# Patient Record
Sex: Male | Born: 1985 | Race: White | Hispanic: No | Marital: Married | Smoking: Never smoker
Health system: Southern US, Community
[De-identification: ages and names within clinical notes are randomized; demographics above are authoritative.]

## PROBLEM LIST (undated history)

## (undated) ENCOUNTER — Ambulatory Visit: Admission: EM | Payer: No Typology Code available for payment source

## (undated) DIAGNOSIS — E785 Hyperlipidemia, unspecified: Secondary | ICD-10-CM

## (undated) DIAGNOSIS — Z8619 Personal history of other infectious and parasitic diseases: Secondary | ICD-10-CM

## (undated) DIAGNOSIS — Z87898 Personal history of other specified conditions: Secondary | ICD-10-CM

## (undated) DIAGNOSIS — J45909 Unspecified asthma, uncomplicated: Secondary | ICD-10-CM

## (undated) DIAGNOSIS — K429 Umbilical hernia without obstruction or gangrene: Secondary | ICD-10-CM

## (undated) DIAGNOSIS — Z8709 Personal history of other diseases of the respiratory system: Secondary | ICD-10-CM

## (undated) DIAGNOSIS — F419 Anxiety disorder, unspecified: Secondary | ICD-10-CM

## (undated) DIAGNOSIS — K4090 Unilateral inguinal hernia, without obstruction or gangrene, not specified as recurrent: Secondary | ICD-10-CM

## (undated) DIAGNOSIS — Z91030 Bee allergy status: Secondary | ICD-10-CM

## (undated) DIAGNOSIS — Z91038 Other insect allergy status: Secondary | ICD-10-CM

## (undated) DIAGNOSIS — K219 Gastro-esophageal reflux disease without esophagitis: Secondary | ICD-10-CM

## (undated) DIAGNOSIS — F32A Depression, unspecified: Secondary | ICD-10-CM

## (undated) DIAGNOSIS — M1A9XX0 Chronic gout, unspecified, without tophus (tophi): Secondary | ICD-10-CM

## (undated) HISTORY — DX: Personal history of other diseases of the respiratory system: Z87.09

## (undated) HISTORY — DX: Other insect allergy status: Z91.038

## (undated) HISTORY — DX: Bee allergy status: Z91.030

## (undated) HISTORY — DX: Personal history of other infectious and parasitic diseases: Z86.19

## (undated) HISTORY — DX: Personal history of other specified conditions: Z87.898

## (undated) HISTORY — DX: Unspecified asthma, uncomplicated: J45.909

---
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
## 2007-03-31 ENCOUNTER — Ambulatory Visit: Payer: Self-pay | Admitting: Internal Medicine

## 2012-04-05 ENCOUNTER — Emergency Department: Payer: Self-pay | Admitting: *Deleted

## 2012-09-28 ENCOUNTER — Emergency Department (HOSPITAL_COMMUNITY): Payer: 59

## 2012-09-28 ENCOUNTER — Encounter (HOSPITAL_COMMUNITY): Payer: Self-pay | Admitting: *Deleted

## 2012-09-28 ENCOUNTER — Encounter (HOSPITAL_COMMUNITY): Payer: Self-pay | Admitting: Anesthesiology

## 2012-09-28 ENCOUNTER — Emergency Department (HOSPITAL_COMMUNITY): Payer: 59 | Admitting: Anesthesiology

## 2012-09-28 ENCOUNTER — Ambulatory Visit: Payer: Self-pay | Admitting: Internal Medicine

## 2012-09-28 ENCOUNTER — Encounter (HOSPITAL_COMMUNITY)
Admission: EM | Disposition: A | Discharge status: Home / Self Care | Payer: Self-pay | Source: Home / Self Care | Attending: Emergency Medicine

## 2012-09-28 ENCOUNTER — Emergency Department (HOSPITAL_COMMUNITY)
Admission: EM | Admit: 2012-09-28 | Discharge: 2012-09-28 | Disposition: A | Discharge status: Home / Self Care | Payer: 59 | Attending: Emergency Medicine | Admitting: Emergency Medicine

## 2012-09-28 DIAGNOSIS — S56129A Laceration of flexor muscle, fascia and tendon of unspecified finger at forearm level, initial encounter: Secondary | ICD-10-CM

## 2012-09-28 DIAGNOSIS — S61209A Unspecified open wound of unspecified finger without damage to nail, initial encounter: Secondary | ICD-10-CM | POA: Insufficient documentation

## 2012-09-28 DIAGNOSIS — W268XXA Contact with other sharp object(s), not elsewhere classified, initial encounter: Secondary | ICD-10-CM | POA: Insufficient documentation

## 2012-09-28 DIAGNOSIS — S61218A Laceration without foreign body of other finger without damage to nail, initial encounter: Secondary | ICD-10-CM

## 2012-09-28 DIAGNOSIS — IMO0002 Reserved for concepts with insufficient information to code with codable children: Secondary | ICD-10-CM | POA: Insufficient documentation

## 2012-09-28 HISTORY — PX: NERVE AND TENDON REPAIR: SHX5693

## 2012-09-28 HISTORY — PX: I & D EXTREMITY: SHX5045

## 2012-09-28 SURGERY — IRRIGATION AND DEBRIDEMENT EXTREMITY
Anesthesia: General | Site: Finger | Laterality: Left | Wound class: Clean

## 2012-09-28 MED ORDER — SUCCINYLCHOLINE CHLORIDE 20 MG/ML IJ SOLN
INTRAMUSCULAR | Status: DC | PRN
  Administered 2012-09-28: 120 mg via INTRAVENOUS

## 2012-09-28 MED ORDER — LACTATED RINGERS IV SOLN
INTRAVENOUS | Status: DC | PRN
  Administered 2012-09-28 (×2): via INTRAVENOUS

## 2012-09-28 MED ORDER — ONDANSETRON HCL 4 MG/2ML IJ SOLN
INTRAMUSCULAR | Status: DC | PRN
  Administered 2012-09-28: 4 mg via INTRAVENOUS

## 2012-09-28 MED ORDER — OXYCODONE-ACETAMINOPHEN 5-325 MG PO TABS
1.0000 | ORAL_TABLET | Freq: Once | ORAL | Status: AC
  Administered 2012-09-28: 1 via ORAL

## 2012-09-28 MED ORDER — HYDROMORPHONE HCL PF 1 MG/ML IJ SOLN: 0.2500 mg | INTRAMUSCULAR | Status: DC | PRN

## 2012-09-28 MED ORDER — CEPHALEXIN 500 MG PO CAPS: 500.0000 mg | ORAL_CAPSULE | Freq: Four times a day (QID) | ORAL | Status: DC

## 2012-09-28 MED ORDER — OXYCODONE-ACETAMINOPHEN 5-325 MG PO TABS: 2.0000 | ORAL_TABLET | ORAL | Status: DC | PRN

## 2012-09-28 MED ORDER — OXYCODONE HCL 5 MG PO TABS: 5.0000 mg | ORAL_TABLET | Freq: Once | ORAL | Status: DC | PRN

## 2012-09-28 MED ORDER — SODIUM CHLORIDE 0.9 % IR SOLN
Status: DC | PRN
  Administered 2012-09-28: 3000 mL

## 2012-09-28 MED ORDER — PROMETHAZINE HCL 25 MG/ML IJ SOLN
6.2500 mg | INTRAMUSCULAR | Status: DC | PRN
  Filled 2012-09-28: qty 1

## 2012-09-28 MED ORDER — PROPOFOL 10 MG/ML IV BOLUS
INTRAVENOUS | Status: DC | PRN
  Administered 2012-09-28: 200 mg via INTRAVENOUS

## 2012-09-28 MED ORDER — CEFAZOLIN SODIUM-DEXTROSE 2-3 GM-% IV SOLR
INTRAVENOUS | Status: DC | PRN
  Administered 2012-09-28: 2 g via INTRAVENOUS

## 2012-09-28 MED ORDER — DEXTROSE 5 % IV SOLN
INTRAVENOUS | Status: DC | PRN
  Administered 2012-09-28: 21:00:00 via INTRAVENOUS

## 2012-09-28 MED ORDER — FENTANYL CITRATE 0.05 MG/ML IJ SOLN
INTRAMUSCULAR | Status: DC | PRN
  Administered 2012-09-28: 50 ug via INTRAVENOUS
  Administered 2012-09-28: 100 ug via INTRAVENOUS

## 2012-09-28 MED ORDER — MIDAZOLAM HCL 5 MG/5ML IJ SOLN
INTRAMUSCULAR | Status: DC | PRN
  Administered 2012-09-28: 2 mg via INTRAVENOUS

## 2012-09-28 MED ORDER — OXYCODONE HCL 5 MG/5ML PO SOLN: 5.0000 mg | Freq: Once | ORAL | Status: DC | PRN

## 2012-09-28 SURGICAL SUPPLY — 49 items
BANDAGE CONFORM 2  STR LF (GAUZE/BANDAGES/DRESSINGS) IMPLANT
BANDAGE ELASTIC 4 VELCRO ST LF (GAUZE/BANDAGES/DRESSINGS) ×2 IMPLANT
BANDAGE GAUZE 4  KLING STR (GAUZE/BANDAGES/DRESSINGS) ×2 IMPLANT
BANDAGE GAUZE ELAST BULKY 4 IN (GAUZE/BANDAGES/DRESSINGS) ×2 IMPLANT
BNDG COHESIVE 4X5 TAN STRL (GAUZE/BANDAGES/DRESSINGS) ×2 IMPLANT
CLOTH BEACON ORANGE TIMEOUT ST (SAFETY) ×2 IMPLANT
CORDS BIPOLAR (ELECTRODE) ×2 IMPLANT
CUFF TOURNIQUET SINGLE 18IN (TOURNIQUET CUFF) ×2 IMPLANT
CUFF TOURNIQUET SINGLE 24IN (TOURNIQUET CUFF) IMPLANT
CUFF TOURNIQUET SINGLE 34IN LL (TOURNIQUET CUFF) IMPLANT
CUFF TOURNIQUET SINGLE 44IN (TOURNIQUET CUFF) IMPLANT
DRSG ADAPTIC 3X8 NADH LF (GAUZE/BANDAGES/DRESSINGS) ×2 IMPLANT
ELECT REM PT RETURN 9FT ADLT (ELECTROSURGICAL) ×2
ELECTRODE REM PT RTRN 9FT ADLT (ELECTROSURGICAL) ×1 IMPLANT
GAUZE XEROFORM 1X8 LF (GAUZE/BANDAGES/DRESSINGS) ×2 IMPLANT
GAUZE XEROFORM 5X9 LF (GAUZE/BANDAGES/DRESSINGS) ×2 IMPLANT
GLOVE BIOGEL M STRL SZ7.5 (GLOVE) IMPLANT
GLOVE SS BIOGEL STRL SZ 8 (GLOVE) ×1 IMPLANT
GLOVE SUPERSENSE BIOGEL SZ 8 (GLOVE) ×1
GOWN PREVENTION PLUS XLARGE (GOWN DISPOSABLE) ×2 IMPLANT
GOWN STRL NON-REIN LRG LVL3 (GOWN DISPOSABLE) ×2 IMPLANT
GOWN STRL REIN XL XLG (GOWN DISPOSABLE) IMPLANT
HANDPIECE INTERPULSE COAX TIP (DISPOSABLE)
KIT BASIN OR (CUSTOM PROCEDURE TRAY) ×2 IMPLANT
KIT ROOM TURNOVER OR (KITS) ×2 IMPLANT
MANIFOLD NEPTUNE II (INSTRUMENTS) ×2 IMPLANT
NEEDLE HYPO 25GX1X1/2 BEV (NEEDLE) ×2 IMPLANT
NS IRRIG 1000ML POUR BTL (IV SOLUTION) ×2 IMPLANT
PACK ORTHO EXTREMITY (CUSTOM PROCEDURE TRAY) ×2 IMPLANT
PAD ARMBOARD 7.5X6 YLW CONV (MISCELLANEOUS) ×4 IMPLANT
PAD CAST 4YDX4 CTTN HI CHSV (CAST SUPPLIES) ×1 IMPLANT
PADDING CAST COTTON 4X4 STRL (CAST SUPPLIES) ×1
SET HNDPC FAN SPRY TIP SCT (DISPOSABLE) IMPLANT
SLING ARM IMMOBILIZER MED (SOFTGOODS) ×2 IMPLANT
SPLINT FIBERGLASS 4X30 (CAST SUPPLIES) ×2 IMPLANT
SPONGE GAUZE 4X4 12PLY (GAUZE/BANDAGES/DRESSINGS) ×2 IMPLANT
SPONGE LAP 18X18 X RAY DECT (DISPOSABLE) ×2 IMPLANT
SPONGE LAP 4X18 X RAY DECT (DISPOSABLE) ×2 IMPLANT
SUT ETHILON 8 0 BV130 4 (SUTURE) ×2 IMPLANT
SUT FIBERWIRE 4-0 18 DIAM BLUE (SUTURE) ×2
SUT PROLENE 4 0 P 3 18 (SUTURE) ×2 IMPLANT
SUTURE FIBERWR 4-0 18 DIA BLUE (SUTURE) ×1 IMPLANT
SYR CONTROL 10ML LL (SYRINGE) ×2 IMPLANT
TOWEL OR 17X24 6PK STRL BLUE (TOWEL DISPOSABLE) ×2 IMPLANT
TOWEL OR 17X26 10 PK STRL BLUE (TOWEL DISPOSABLE) ×2 IMPLANT
TUBE ANAEROBIC SPECIMEN COL (MISCELLANEOUS) IMPLANT
TUBE CONNECTING 12X1/4 (SUCTIONS) ×2 IMPLANT
WATER STERILE IRR 1000ML POUR (IV SOLUTION) ×2 IMPLANT
YANKAUER SUCT BULB TIP NO VENT (SUCTIONS) ×2 IMPLANT

## 2012-09-28 NOTE — Op Note (Signed)
See Dictation #[id number] Dominica Severin MD

## 2012-09-28 NOTE — Transfer of Care (Signed)
Immediate Anesthesia Transfer of Care Note  Patient: Clifford Hall  Procedure(s) Performed: Procedure(s) (LRB) with comments: IRRIGATION AND DEBRIDEMENT EXTREMITY (Left) NERVE AND TENDON REPAIR (Left)  Patient Location: PACU  Anesthesia Type:General  Level of Consciousness: awake, alert  and oriented  Airway & Oxygen Therapy: Patient Spontanous Breathing and Patient connected to nasal cannula oxygen  Post-op Assessment: Report given to PACU RN and Post -op Vital signs reviewed and stable  Post vital signs: Reviewed and stable  Complications: No apparent anesthesia complications

## 2012-09-28 NOTE — Anesthesia Procedure Notes (Signed)
Procedure Name: Intubation Date/Time: 09/28/2012 8:40 PM Performed by: Antonio Creswell S Pre-anesthesia Checklist: Patient identified, Timeout performed, Emergency Drugs available, Suction available and Patient being monitored Patient Re-evaluated:Patient Re-evaluated prior to inductionOxygen Delivery Method: Circle system utilized Preoxygenation: Pre-oxygenation with 100% oxygen Intubation Type: IV induction and Rapid sequence Ventilation: Mask ventilation without difficulty Laryngoscope Size: Mac and 4 Grade View: Grade I Tube type: Oral Tube size: 7.5 mm Number of attempts: 1 Airway Equipment and Method: Stylet Placement Confirmation: ETT inserted through vocal cords under direct vision,  positive ETCO2 and breath sounds checked- equal and bilateral Secured at: 22 cm Tube secured with: Tape Dental Injury: Teeth and Oropharynx as per pre-operative assessment

## 2012-09-28 NOTE — H&P (Signed)
Clifford Hall is an 26 y.o. male.   Chief Complaint: Finger injury left index finger with pain and loss of function HPI: Patient presents with a left index finger laceration. He complains of severe pain and numbness about the radial digital nerve distribution. He notes no prior injury. He works with [employer]. He is a nice gentleman who sustained a laceration on glass. I reviewed his findings and all issues. He denies other complaints. His pain is mild to moderate. It is described as constant.Marland KitchenMarland KitchenPatient presents for evaluation and treatment of the of their upper extremity predicament. The patient denies neck back chest or of abdominal pain. The patient notes that they have no lower extremity problems. The patient from primarily complains of the upper extremity pain noted.  History reviewed. No pertinent past medical history.  History reviewed. No pertinent past surgical history.  No family history on file. Social History:  reports that he has never smoked. He does not have any smokeless tobacco history on file. He reports that he does not drink alcohol. His drug history not on file.  Allergies:  Allergies  Allergen Reactions  . Bee Venom Anaphylaxis     (Not in a hospital admission)  No results found for this or any previous visit (from the past 48 hour(s)). Dg Hand Complete Left  09/28/2012  *RADIOLOGY REPORT*  Clinical Data: Laceration left index finger on glass  LEFT HAND - COMPLETE 3+ VIEW  Comparison: None  Findings: Dressing artifacts index finger. Osseous mineralization normal. Joint spaces preserved. No acute fracture, dislocation or bone destruction. No definite radiopaque foreign bodies.  IMPRESSION: No acute abnormalities.   Original Report Authenticated By: Ulyses Southward, M.D.     Review of Systems  Constitutional: Negative.   HENT: Negative.   Eyes: Negative.   Respiratory: Negative.   Cardiovascular: Negative.   Gastrointestinal: Negative.    Genitourinary: Negative.   Skin: Negative.   Neurological: Negative.   Endo/Heme/Allergies: Negative.   Psychiatric/Behavioral: Negative.     Blood pressure 131/70, pulse 62, temperature 98.3 F (36.8 C), temperature source Oral, resp. rate 18, SpO2 98.00%. Physical Exam  the patient has a laceration to the index finger left hand with loss of sensation in the radial digital nerve. He has intact capillary refill. He has some flexion but this is painful against resistance and is difficult to rule out a flexor tendon laceration. The remaining fingers are intact there is no evidence of infection or vascular compromise.  Marland Kitchen.The patient is alert and oriented in no acute distress the patient complains of pain in the affected upper extremity. The patient is noted to have a normal HEENT exam. Lung fields show equal chest expansion and no shortness of breath abdomen exam is nontender without distention. Lower extremity examination does not show any fracture dislocation or blood clot symptoms. Pelvis is stable neck and back are stable and nontender Assessment/Plan .Marland KitchenWe are planning surgery for your upper extremity. The risk and benefits of surgery include risk of bleeding infection anesthesia damage to normal structures and failure of the surgery to accomplish its intended goals of relieving symptoms and restoring function with this in mind we'll going to proceed. I have specifically discussed with the patient the pre-and postoperative regime and the does and don'ts and risk and benefits in great detail. Risk and benefits of surgery also include risk of dystrophy chronic nerve pain failure of the healing process to go onto completion and other inherent risks of surgery The relavent the  pathophysiology of the disease/injury process, as well as the alternatives for treatment and postoperative course of action has been discussed in great detail with the patient who desires to proceed.  We will do everything in our  power to help you (the patient) restore function to the upper extremity. Is a pleasure to see this patient today.  Karen Chafe 09/28/2012, 8:06 PM

## 2012-09-28 NOTE — Preoperative (Signed)
Beta Blockers   Reason not to administer Beta Blockers:Not Applicable 

## 2012-09-28 NOTE — ED Provider Notes (Signed)
History  This chart was scribed for Loren Racer, MD by Manuela Schwartz, ED scribe. This patient was seen in room TR06C/TR06C and the patient's care was started at 1744.   CSN: [id number]  Arrival date & time 09/28/12  1744   First MD Initiated Contact with Patient 09/28/12 1815      Chief Complaint  Patient presents with  . Laceration   The history is provided by the patient. No language interpreter was used.   Clifford Hall is a 26 y.o. male who presents to the Emergency Department complaining of laceration to laceration to index finger left hand after window pain glass broke over his hand and cut his finger. He states was seen at [facility] and was told to come to ER with a hand specialist. He said had 1 stitch and wound cleansing put by the [facility]. He states associated tingling distally to laceration but still has sensation and minimal movement.   History reviewed. No pertinent past medical history.  Past Surgical History  Procedure Date  . I&d extremity 09/28/2012    Procedure: IRRIGATION AND DEBRIDEMENT EXTREMITY;  Surgeon: Dominica Severin, MD;  Location: [facility] OR;  Service: Orthopedics;  Laterality: Left;  . Nerve and tendon repair 09/28/2012    Procedure: NERVE AND TENDON REPAIR;  Surgeon: Dominica Severin, MD;  Location: [facility] OR;  Service: Orthopedics;  Laterality: Left;    No family history on file.  History  Substance Use Topics  . Smoking status: Never Smoker   . Smokeless tobacco: Not on file  . Alcohol Use: No      Review of Systems  Constitutional: Negative for fever and chills.  Respiratory: Negative for shortness of breath.   Gastrointestinal: Negative for nausea and vomiting.  Skin: Positive for wound.       Laceration to index finger left hand  Neurological: Negative for weakness.  All other systems reviewed and are negative.    Allergies  Bee venom  Home Medications   Current Outpatient Rx  Name  Route  Sig  Dispense  Refill  .  EPINEPHRINE 0.3 MG/0.3ML IJ DEVI   Intramuscular   Inject 0.3 mg into the muscle once.         . IBUPROFEN 200 MG PO TABS   Oral   Take 400 mg by mouth every 6 (six) hours as needed. For pain         . ADULT MULTIVITAMIN W/MINERALS CH   Oral   Take 1 tablet by mouth daily.         . CEPHALEXIN 500 MG PO CAPS   Oral   Take 1 capsule (500 mg total) by mouth 4 (four) times daily.   40 capsule   0   . OXYCODONE-ACETAMINOPHEN 5-325 MG PO TABS   Oral   Take 2 tablets by mouth every 4 (four) hours as needed for pain.   55 tablet   0     Triage Vitals: BP 131/70  Pulse 62  Temp 98.3 F (36.8 C) (Oral)  Resp 18  SpO2 98%  Physical Exam  Nursing note and vitals reviewed. Constitutional: He is oriented to person, place, and time. He appears well-developed and well-nourished. No distress.  HENT:  Head: Normocephalic and atraumatic.  Eyes: EOM are normal.  Neck: Neck supple. No tracheal deviation present.  Cardiovascular: Normal rate.   Pulmonary/Chest: Effort normal. No respiratory distress.  Musculoskeletal: Normal range of motion.  Neurological: He is alert and oriented to person, place,  and time.  Skin: Skin is warm and dry.       2 cm laceration to palmar surface index finger left hand, visible severed tendon and mild oozing of blood. Still w/ROM and some distal tingling and numbness  Psychiatric: He has a normal mood and affect. His behavior is normal.    ED Course  Procedures (including critical care time) DIAGNOSTIC STUDIES: Oxygen Saturation is 98% on room air, normal by my interpretation.    COORDINATION OF CARE: At 630 PM Discussed treatment plan with patient which includes left hand X-ray. Patient agrees.   Labs Reviewed - No data to display Dg Hand Complete Left  09/28/2012  *RADIOLOGY REPORT*  Clinical Data: Laceration left index finger on glass  LEFT HAND - COMPLETE 3+ VIEW  Comparison: None  Findings: Dressing artifacts index finger. Osseous  mineralization normal. Joint spaces preserved. No acute fracture, dislocation or bone destruction. No definite radiopaque foreign bodies.  IMPRESSION: No acute abnormalities.   Original Report Authenticated By: Ulyses Southward, M.D.      1. Laceration of finger, index   2. Flexor tendon laceration of finger with open wound       MDM  I personally performed the services described in this documentation, which was scribed in my presence. The recorded information has been reviewed and is accurate.  Dr Amanda Pea to see in ED and to take to OR for repair         Loren Racer, MD 09/29/12 2206

## 2012-09-28 NOTE — ED Notes (Signed)
The pt has a laceration to the lt index finger he did with a broken glass.  He first went to [facility] urgent care that placed a few sutures to stop the bleeding but the person there felt like the pt needed a hand surgeon.  Bandaged not removed.  No active bleeding on the bandage.  He has feeling in the finger

## 2012-09-28 NOTE — ED Notes (Signed)
Patient says he was replacing a broken window and cut his left index finger.  He responded to [facility] in [place], [place] and they put injected lidocaine, put one stitch on it and sent him here.

## 2012-09-28 NOTE — ED Notes (Signed)
Patient transported to X-ray 

## 2012-09-28 NOTE — ED Notes (Addendum)
Patient transported back to FT 6 from X-ray.

## 2012-09-28 NOTE — Anesthesia Postprocedure Evaluation (Signed)
  Anesthesia Post-op Note  Patient: Clifford Hall  Procedure(s) Performed: Procedure(s) (LRB) with comments: IRRIGATION AND DEBRIDEMENT EXTREMITY (Left) NERVE AND TENDON REPAIR (Left)  Patient Location: PACU  Anesthesia Type:General  Level of Consciousness: awake  Airway and Oxygen Therapy: Patient Spontanous Breathing  Post-op Pain: mild  Post-op Assessment: Post-op Vital signs reviewed, Patient's Cardiovascular Status Stable, Respiratory Function Stable, Patent Airway, No signs of Nausea or vomiting and Pain level controlled  Post-op Vital Signs: stable  Complications: No apparent anesthesia complications

## 2012-09-28 NOTE — Anesthesia Preprocedure Evaluation (Addendum)
Anesthesia Evaluation  Patient identified by MRN, date of birth, ID band Patient awake    Reviewed: Allergy & Precautions, H&P , NPO status , Patient's Chart, lab work & pertinent test results  Airway Mallampati: I TM Distance: >3 FB Neck ROM: Full    Dental  (+) Teeth Intact and Dental Advisory Given   Pulmonary  breath sounds clear to auscultation        Cardiovascular Rhythm:Regular Rate:Normal     Neuro/Psych    GI/Hepatic   Endo/Other    Renal/GU      Musculoskeletal   Abdominal   Peds  Hematology   Anesthesia Other Findings   Reproductive/Obstetrics                          Anesthesia Physical Anesthesia Plan  ASA: I and emergent  Anesthesia Plan: General   Post-op Pain Management:    Induction: Intravenous, Rapid sequence and Cricoid pressure planned  Airway Management Planned: Oral ETT  Additional Equipment:   Intra-op Plan:   Post-operative Plan: Extubation in OR  Informed Consent: I have reviewed the patients History and Physical, chart, labs and discussed the procedure including the risks, benefits and alternatives for the proposed anesthesia with the patient or authorized representative who has indicated his/her understanding and acceptance.   Dental advisory given  Plan Discussed with: CRNA and Surgeon  Anesthesia Plan Comments:       Anesthesia Quick Evaluation

## 2012-09-29 ENCOUNTER — Encounter (HOSPITAL_COMMUNITY): Payer: Self-pay | Admitting: Orthopedic Surgery

## 2012-09-29 NOTE — Op Note (Signed)
NAME:  Clifford Hall, Clifford Hall    ACCOUNT NO.:  [id number]  MEDICAL RECORD NO.:  [id number]  LOCATION:  OTFC                         FACILITY:  [facility]  PHYSICIAN:  Dionne Ano. Bernadett Milian, M.D.DATE OF BIRTH:  Dec 07, 1985  DATE OF PROCEDURE:  09/28/2012 DATE OF DISCHARGE:                              OPERATIVE REPORT   PREOP DIAGNOSIS:  Status post laceration left index finger with tendon and nerve injury.  POSTOP DIAGNOSIS:  Radial digital nerve complete laceration at the DIP level left index finger as well as flexor digitorum profundus laceration left index finger status post sharp glass laceration.  PROCEDURE:  Irrigation and debridement of skin, subcutaneous tissue, tendon, nerve, periosteal tissue, and volar plate about the distal interphalangeal joint. 1. This was an excisional debridement with curette, knife, blade, and     scissor tip. 2. Repair of flexor digitorum profundus, left index finger. 3. Repair of volar plate, left index finger. 4. Repair of radial digital nerve with 8-0 nylon in an epineurial     technique, left index finger.  SURGEON:  Dionne Ano. Amanda Pea, M.D.  ASSISTANT:  None.  COMPLICATION:  None.  ANESTHESIA:  General.  TOURNIQUET TIME:  Less than an hour.  INDICATIONS:  A 26 year old male, status post laceration with the above- mentioned injury process.  I have counseled him in regards to risks and benefits of surgery, and he desires to proceed.  OPERATIVE PROCEDURE:  The patient was seen by myself and anesthesia, taken to operative suite, underwent smooth induction general anesthesia, laid supine.  Following this, prepped and draped in the usual sterile fashion with Betadine scrub and paint.  Time-out was called.  Pre and postop check was complete.  He then underwent an I and D of skin, subcutaneous tissue, tendon, periosteal volar plate, and associated structures.  Following this, the patient then had 3 L of saline placed through and through the  laceration about the left index finger.  This was an excisional debridement with curette, knife, blade, and scissor. Following the irrigation and debridement, attention was turned towards the damaged structures.  I repaired the volar plate with 4-0 Prolene as this was tacked together nicely.  This was done without difficulty and the volar plate repair was accomplished.  Following this, I performed a zone 2 flexor digitorum profundus repair. I used a 4-0 FiberWire suture with modified Kessler to [employer]. There was no complicating features.  There was a portion most ulnarly that was still intact.  I was very careful to not bunch the tendon and to be very careful with my technique.  I was pleased with this in the findings and noted excellent tendon excursion at conclusion.  Following this (the tendon repair), I then turned attention towards the radial digital nerve.  This was repaired with an epineurial technique at the trifurcation level.  I was able to group the digital nerve together just prior to the trifurcation and epineurial sutures of the 8-0 variety under loupe magnification was performed.  The nerve ends coapted well. There were no complicating features.  I then irrigated additionally.  I should note the ulnar neurovascular bundle was intact, there was no complicating features.  Following this, I then performed suturing of the wound and placed a dressing  of Adaptic Xeroform and a dorsal blocking splint.  The patient tolerated this well.  He will return to the office to see [place] in 3 days to begin therapy.  I have discussed with him relevant issues, do's and don'ts, etc.  Should any problems arise, they will notify us; otherwise, I will look forward to seeing him back in the office in 3-5 days for wound check and begin therapy.  Do's and don'ts have been discussed and all questions have been encouraged and answered.  It was pleasure to see him today and participate in his  care.  He was given additional gram of Ancef and discharged home from the PACU with the precautions as noted in the chart.     Dionne Ano. Amanda Pea, M.D.     [facility]  D:  09/28/2012  T:  09/29/2012  Job:  [id number]

## 2014-03-31 ENCOUNTER — Emergency Department: Payer: Self-pay | Admitting: Emergency Medicine

## 2015-06-30 DIAGNOSIS — Z91038 Other insect allergy status: Secondary | ICD-10-CM | POA: Insufficient documentation

## 2015-06-30 DIAGNOSIS — Z91030 Bee allergy status: Secondary | ICD-10-CM | POA: Insufficient documentation

## 2015-07-20 ENCOUNTER — Ambulatory Visit (INDEPENDENT_AMBULATORY_CARE_PROVIDER_SITE_OTHER): Payer: 59 | Admitting: Neurology

## 2015-07-20 DIAGNOSIS — Z91030 Bee allergy status: Secondary | ICD-10-CM

## 2015-07-20 DIAGNOSIS — Z91038 Other insect allergy status: Secondary | ICD-10-CM

## 2015-07-26 ENCOUNTER — Ambulatory Visit (INDEPENDENT_AMBULATORY_CARE_PROVIDER_SITE_OTHER): Payer: 59

## 2015-07-26 DIAGNOSIS — Z91038 Other insect allergy status: Secondary | ICD-10-CM

## 2015-07-26 DIAGNOSIS — Z91030 Bee allergy status: Secondary | ICD-10-CM

## 2015-08-01 ENCOUNTER — Ambulatory Visit (INDEPENDENT_AMBULATORY_CARE_PROVIDER_SITE_OTHER): Payer: 59

## 2015-08-01 DIAGNOSIS — Z91038 Other insect allergy status: Secondary | ICD-10-CM

## 2015-08-01 DIAGNOSIS — Z91030 Bee allergy status: Secondary | ICD-10-CM

## 2015-08-23 ENCOUNTER — Ambulatory Visit (INDEPENDENT_AMBULATORY_CARE_PROVIDER_SITE_OTHER): Payer: 59

## 2015-08-23 DIAGNOSIS — T63441D Toxic effect of venom of bees, accidental (unintentional), subsequent encounter: Secondary | ICD-10-CM

## 2015-09-18 ENCOUNTER — Ambulatory Visit (INDEPENDENT_AMBULATORY_CARE_PROVIDER_SITE_OTHER): Payer: 59

## 2015-09-18 DIAGNOSIS — T63441D Toxic effect of venom of bees, accidental (unintentional), subsequent encounter: Secondary | ICD-10-CM

## 2015-09-28 ENCOUNTER — Ambulatory Visit (INDEPENDENT_AMBULATORY_CARE_PROVIDER_SITE_OTHER): Payer: 59 | Admitting: *Deleted

## 2015-09-28 DIAGNOSIS — Z91030 Bee allergy status: Secondary | ICD-10-CM

## 2015-09-28 DIAGNOSIS — Z91038 Other insect allergy status: Secondary | ICD-10-CM | POA: Diagnosis not present

## 2015-10-05 ENCOUNTER — Ambulatory Visit (INDEPENDENT_AMBULATORY_CARE_PROVIDER_SITE_OTHER): Payer: 59 | Admitting: *Deleted

## 2015-10-05 DIAGNOSIS — T63441D Toxic effect of venom of bees, accidental (unintentional), subsequent encounter: Secondary | ICD-10-CM | POA: Diagnosis not present

## 2015-10-11 ENCOUNTER — Ambulatory Visit (INDEPENDENT_AMBULATORY_CARE_PROVIDER_SITE_OTHER): Payer: 59

## 2015-10-11 DIAGNOSIS — T63441D Toxic effect of venom of bees, accidental (unintentional), subsequent encounter: Secondary | ICD-10-CM | POA: Diagnosis not present

## 2015-10-11 NOTE — Progress Notes (Signed)
This encounter was created in error - please disregard.

## 2015-11-01 ENCOUNTER — Ambulatory Visit (INDEPENDENT_AMBULATORY_CARE_PROVIDER_SITE_OTHER): Payer: 59

## 2015-11-01 DIAGNOSIS — T63441D Toxic effect of venom of bees, accidental (unintentional), subsequent encounter: Secondary | ICD-10-CM

## 2015-11-07 ENCOUNTER — Ambulatory Visit (INDEPENDENT_AMBULATORY_CARE_PROVIDER_SITE_OTHER): Payer: 59 | Admitting: Neurology

## 2015-11-07 DIAGNOSIS — T63441D Toxic effect of venom of bees, accidental (unintentional), subsequent encounter: Secondary | ICD-10-CM

## 2015-11-12 ENCOUNTER — Emergency Department: Payer: 59

## 2015-11-12 ENCOUNTER — Encounter: Payer: Self-pay | Admitting: *Deleted

## 2015-11-12 ENCOUNTER — Emergency Department
Admission: EM | Admit: 2015-11-12 | Discharge: 2015-11-12 | Disposition: A | Discharge status: Home / Self Care | Payer: 59 | Attending: Emergency Medicine | Admitting: Emergency Medicine

## 2015-11-12 DIAGNOSIS — Z792 Long term (current) use of antibiotics: Secondary | ICD-10-CM | POA: Insufficient documentation

## 2015-11-12 DIAGNOSIS — Z79899 Other long term (current) drug therapy: Secondary | ICD-10-CM | POA: Insufficient documentation

## 2015-11-12 DIAGNOSIS — R519 Headache, unspecified: Secondary | ICD-10-CM

## 2015-11-12 DIAGNOSIS — R51 Headache: Secondary | ICD-10-CM | POA: Insufficient documentation

## 2015-11-12 LAB — CBC WITH DIFFERENTIAL/PLATELET
BASOS ABS: 0 10*3/uL (ref 0–0.1)
BASOS PCT: 0 %
EOS PCT: 0 %
Eosinophils Absolute: 0 10*3/uL (ref 0–0.7)
HCT: 40 % (ref 40.0–52.0)
Hemoglobin: 13.3 g/dL (ref 13.0–18.0)
LYMPHS PCT: 9 %
Lymphs Abs: 1.5 10*3/uL (ref 1.0–3.6)
MCH: 27.9 pg (ref 26.0–34.0)
MCHC: 33.3 g/dL (ref 32.0–36.0)
MCV: 83.7 fL (ref 80.0–100.0)
MONO ABS: 1 10*3/uL (ref 0.2–1.0)
Monocytes Relative: 7 %
Neutro Abs: 13.4 10*3/uL — ABNORMAL HIGH (ref 1.4–6.5)
Neutrophils Relative %: 84 %
PLATELETS: 303 10*3/uL (ref 150–440)
RBC: 4.77 MIL/uL (ref 4.40–5.90)
RDW: 13.6 % (ref 11.5–14.5)
WBC: 16 10*3/uL — AB (ref 3.8–10.6)

## 2015-11-12 LAB — BASIC METABOLIC PANEL
ANION GAP: 10 (ref 5–15)
BUN: 17 mg/dL (ref 6–20)
CALCIUM: 9.5 mg/dL (ref 8.9–10.3)
CO2: 22 mmol/L (ref 22–32)
Chloride: 105 mmol/L (ref 101–111)
Creatinine, Ser: 0.83 mg/dL (ref 0.61–1.24)
Glucose, Bld: 106 mg/dL — ABNORMAL HIGH (ref 65–99)
POTASSIUM: 3.4 mmol/L — AB (ref 3.5–5.1)
SODIUM: 137 mmol/L (ref 135–145)

## 2015-11-12 MED ORDER — SODIUM CHLORIDE 0.9 % IV BOLUS (SEPSIS)
1000.0000 mL | Freq: Once | INTRAVENOUS | Status: AC
  Administered 2015-11-12: 1000 mL via INTRAVENOUS

## 2015-11-12 MED ORDER — PROCHLORPERAZINE EDISYLATE 5 MG/ML IJ SOLN
10.0000 mg | Freq: Four times a day (QID) | INTRAMUSCULAR | Status: DC | PRN
  Administered 2015-11-12: 10 mg via INTRAVENOUS
  Filled 2015-11-12: qty 2

## 2015-11-12 MED ORDER — DIPHENHYDRAMINE HCL 50 MG/ML IJ SOLN
12.5000 mg | Freq: Once | INTRAMUSCULAR | Status: AC
  Administered 2015-11-12: 12.5 mg via INTRAVENOUS
  Filled 2015-11-12: qty 1

## 2015-11-12 MED ORDER — KETOROLAC TROMETHAMINE 30 MG/ML IJ SOLN
30.0000 mg | Freq: Once | INTRAMUSCULAR | Status: AC
  Administered 2015-11-12: 30 mg via INTRAVENOUS
  Filled 2015-11-12: qty 1

## 2015-11-12 NOTE — ED Notes (Signed)
Pt states he was driving home from work with a HA, when he felt his arms and legs go numb. The pt states he pulled over on the side of the road and took a 5 minute nap. Pt reports hx of 3-4 HA per week, today's HA was worse.

## 2015-11-12 NOTE — Discharge Instructions (Signed)
°  If you have a change in your chronic headache, numbness, weakness, trouble talking or you feel worse in any way including fever, return to the emergency department. Follow up closely with neurology for your chronic headaches.

## 2015-11-12 NOTE — ED Provider Notes (Addendum)
[facility] Emergency Department Provider Note  ____________________________________________   I have reviewed the triage vital signs and the nursing notes.   HISTORY  Chief Complaint Headache    HPI Clifford Hall is a 30 y.o. male who states that he has had a headache for 4 years 3 times a week every week. He sees a Land and it helps. He feels it is related to muscle tension. He denies ever seeing a neurologist about this or having any imaging. The patient states he was having his normal headache which was right sided radiating to the back, 5 out of 10 today as he was driving home but then he noticed tingling in both of his hands and then tingling in both of his feet and then tingling across his entire abdomen and then tingling throughout his whole body. He did not have any weakness. The symptom has resolved. His wife said he was breathing very quickly and shallowly for some reason. The patient has had no other neurologic complaint of change in vision or difficulty speaking or walking. The patient states that at this time he has a mild headache for him 5 out of 10 which is his normal headache not the worst headache of life. It was not sudden onset. He states that he has had no fever or stiff neck. He does state that he does not drink alcohol any significance or use cocaine. He states that he did not get much sleep last night because he has a 109-year-old child kept Him up. Patient states he has a stressful job. He does not recall having a panic attack in the past. At this time he has no neurologic complaint and a mild headache.     No past medical history on file.  Patient Active Problem List   Diagnosis Date Noted  . 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 I&d extremity  09/28/2012    Procedure: IRRIGATION AND DEBRIDEMENT EXTREMITY;  Surgeon: Dominica Severin, MD;  Location: [facility] OR;  Service:  Orthopedics;  Laterality: Left;  . Nerve and tendon repair  09/28/2012    Procedure: NERVE AND TENDON REPAIR;  Surgeon: Dominica Severin, MD;  Location: [facility] OR;  Service: Orthopedics;  Laterality: Left;    Current Outpatient Rx  Name  Route  Sig  Dispense  Refill  . Albuterol Sulfate (PROAIR RESPICLICK) 108 (90 BASE) MCG/ACT AEPB   Inhalation   Inhale 2 puffs into the lungs as needed (every 4-6 hours for cough or wheeze).         . cephALEXin (KEFLEX) 500 MG capsule   Oral   Take 1 capsule (500 mg total) by mouth 4 (four) times daily.   40 capsule   0   . EPINEPHrine (EPIPEN) 0.3 mg/0.3 mL DEVI   Intramuscular   Inject 0.3 mg into the muscle once.         Marland Kitchen ibuprofen (ADVIL,MOTRIN) 200 MG tablet   Oral   Take 400 mg by mouth every 6 (six) hours as needed. For pain         . Multiple Vitamin (MULTIVITAMIN WITH MINERALS) TABS   Oral   Take 1 tablet by mouth daily.         Marland Kitchen oxyCODONE-acetaminophen (ROXICET) 5-325 MG per tablet   Oral   Take 2 tablets by mouth every 4 (four) hours as needed for pain.   55 tablet   0     Allergies  Bee venom  No family history on file.  Social History Social History  Substance Use Topics  . Smoking status: Never Smoker   . Smokeless tobacco: None  . Alcohol Use: No    Review of Systems Constitutional: No fever/chills Eyes: No visual changes. ENT: No sore throat. No stiff neck no neck pain Cardiovascular: Denies chest pain. Respiratory: Denies shortness of breath. Gastrointestinal:   no vomiting.  No diarrhea.  No constipation. Genitourinary: Negative for dysuria. Musculoskeletal: Negative lower extremity swelling Skin: Negative for rash. Neurological: Negative for headaches, focal weakness or numbness. 10-point ROS otherwise negative.  ____________________________________________   PHYSICAL EXAM:  VITAL SIGNS: ED Triage Vitals  Enc Vitals Group     BP 11/12/15 2035 110/75 mmHg     Pulse Rate 11/12/15 2035 58      Resp 11/12/15 2035 20     Temp 11/12/15 2035 97.5 F (36.4 C)     Temp Source 11/12/15 2035 Oral     SpO2 11/12/15 2035 99 %     Weight 11/12/15 2035 175 lb (79.379 kg)     Height 11/12/15 2035  (1.727 m)     Head Cir --      Peak Flow --      Pain Score 11/12/15 2037 10     Pain Loc --      Pain Edu? --      Excl. in GC? --     Constitutional: Alert and oriented. Well appearing and in no acute distress. Eyes: Conjunctivae are normal. PERRL. EOMI. Head: Atraumatic. Nose: No congestion/rhinnorhea. Mouth/Throat: Mucous membranes are moist.  Oropharynx non-erythematous. Neck: No stridor.   Nontender with no meningismus Cardiovascular: Normal rate, regular rhythm. Grossly normal heart sounds.  Good peripheral circulation. Respiratory: Normal respiratory effort.  No retractions. Lungs CTAB. Abdominal: Soft and nontender. No distention. No guarding no rebound Back:  There is no focal tenderness or step off there is no midline tenderness there are no lesions noted. there is no CVA tenderness Musculoskeletal: No lower extremity tenderness. No joint effusions, no DVT signs strong distal pulses no edema Neurologic:  Cranial nerves II through XII are grossly intact 5 out of 5 strength bilateral upper and lower extremity. Finger to nose within normal limits heel to shin within normal limits, speech is normal with no word finding difficulty or dysarthria, reflexes symmetric, pupils are equally round and reactive to light, there is no pronator drift, sensation is normal, vision is intact to confrontation, gait is deferred, there is no nystagmus, normal neurologic exam Skin:  Skin is warm, dry and intact. No rash noted. Psychiatric: Mood and affect are somewhat anxious.Marland Kitchen Speech and behavior are normal.  ____________________________________________   LABS (all labs ordered are listed, but only abnormal results are displayed)  Labs Reviewed  CBC WITH DIFFERENTIAL/PLATELET  BASIC METABOLIC  PANEL   ____________________________________________  EKG  I personally interpreted any EKGs ordered by me or triage  ____________________________________________  RADIOLOGY  I reviewed any imaging ordered by me or triage that were performed during my shift ____________________________________________   PROCEDURES  Procedure(s) performed: None  Critical Care performed: None  ____________________________________________   INITIAL IMPRESSION / ASSESSMENT AND PLAN / ED COURSE  Pertinent labs & imaging results that were available during my care of the patient were reviewed by me and considered in my medical decision making (see chart for details).   patient with chronic nearly daily headaches presents today with a headache which she states is his normal headache. Today  the difference was his entire body was tingling. When I talked his wife she and he both agree that he was breathing very rapidly and shallowly. He denies any history of panic attacks but he he states that he was very anxious at the time. He has a completely normal neurologic exam with an NIH stroke scale of 0. We'll treat him for possible recurrent chronic migraine and because he has had no imaging I will obtain a CT scan of his head. Patient is really not a candidate for TPA as there is no neurologic symptoms. Nor is there anything at this time making suspicious of a bleed or meningitis given chronicity of symptoms. I do not believe generalized tingling to represent a focal neurologic deficit.  ----------------------------------------- 10:40 PM on 11/12/2015 -----------------------------------------  Patient remains neurovascularly intact. I have explained him that his CT is negative, he does have an elevated white count which is nonspecific. I have explained to him the limitations of a workup without LP the risks benefits and alternative to the LP and the patient declines. I do think this is the right course for him  at this time given that I have very low suspicion for bleed or meningitis. Patient would like some "something else" to see if he can get the headache completely gone. His down to a 2 or 3. He remains neurologically intact. Patient has been suffering from some degree of exhaustion he is not clinically depressed with no HI or SI. He is not driving home. We will give him another shot of pain medication prior to discharge. I strongly advised the patient follow up with neurology for his chronic headaches. ____________________________________________   FINAL CLINICAL IMPRESSION(S) / ED DIAGNOSES  Final diagnoses:  None    Jeanmarie Plant, MD 11/12/15 2130  Jeanmarie Plant, MD 11/12/15 2241  Jeanmarie Plant, MD 11/12/15 2242

## 2015-11-12 NOTE — ED Notes (Signed)
Patient transported to CT 

## 2015-11-12 NOTE — ED Notes (Addendum)
Pt reports a right side headache since this afternoon.  Pt reports tingling all over.  Pt pale.  Hx migraines h/a . No otc meds today for h/a  Pt alert.  Speech clear.

## 2015-11-13 ENCOUNTER — Ambulatory Visit (INDEPENDENT_AMBULATORY_CARE_PROVIDER_SITE_OTHER): Payer: 59

## 2015-11-13 DIAGNOSIS — T63441D Toxic effect of venom of bees, accidental (unintentional), subsequent encounter: Secondary | ICD-10-CM | POA: Diagnosis not present

## 2015-11-14 DIAGNOSIS — J069 Acute upper respiratory infection, unspecified: Secondary | ICD-10-CM | POA: Diagnosis not present

## 2015-11-14 DIAGNOSIS — R51 Headache: Secondary | ICD-10-CM | POA: Diagnosis not present

## 2015-11-21 ENCOUNTER — Ambulatory Visit (INDEPENDENT_AMBULATORY_CARE_PROVIDER_SITE_OTHER): Payer: 59

## 2015-11-21 DIAGNOSIS — T63441D Toxic effect of venom of bees, accidental (unintentional), subsequent encounter: Secondary | ICD-10-CM | POA: Diagnosis not present

## 2015-11-29 ENCOUNTER — Ambulatory Visit (INDEPENDENT_AMBULATORY_CARE_PROVIDER_SITE_OTHER): Payer: 59

## 2015-11-29 DIAGNOSIS — T63441D Toxic effect of venom of bees, accidental (unintentional), subsequent encounter: Secondary | ICD-10-CM | POA: Diagnosis not present

## 2015-12-07 ENCOUNTER — Ambulatory Visit (INDEPENDENT_AMBULATORY_CARE_PROVIDER_SITE_OTHER): Payer: 59 | Admitting: Neurology

## 2015-12-07 DIAGNOSIS — T63441D Toxic effect of venom of bees, accidental (unintentional), subsequent encounter: Secondary | ICD-10-CM | POA: Diagnosis not present

## 2015-12-12 ENCOUNTER — Ambulatory Visit (INDEPENDENT_AMBULATORY_CARE_PROVIDER_SITE_OTHER): Payer: 59

## 2015-12-12 DIAGNOSIS — T63441D Toxic effect of venom of bees, accidental (unintentional), subsequent encounter: Secondary | ICD-10-CM

## 2015-12-17 ENCOUNTER — Ambulatory Visit (INDEPENDENT_AMBULATORY_CARE_PROVIDER_SITE_OTHER): Payer: 59

## 2015-12-17 DIAGNOSIS — G4489 Other headache syndrome: Secondary | ICD-10-CM | POA: Diagnosis not present

## 2015-12-17 DIAGNOSIS — T63441D Toxic effect of venom of bees, accidental (unintentional), subsequent encounter: Secondary | ICD-10-CM

## 2015-12-24 ENCOUNTER — Ambulatory Visit (INDEPENDENT_AMBULATORY_CARE_PROVIDER_SITE_OTHER): Payer: 59

## 2015-12-24 DIAGNOSIS — T63441D Toxic effect of venom of bees, accidental (unintentional), subsequent encounter: Secondary | ICD-10-CM | POA: Diagnosis not present

## 2016-01-08 ENCOUNTER — Ambulatory Visit (INDEPENDENT_AMBULATORY_CARE_PROVIDER_SITE_OTHER): Payer: 59

## 2016-01-08 DIAGNOSIS — T63441D Toxic effect of venom of bees, accidental (unintentional), subsequent encounter: Secondary | ICD-10-CM

## 2016-01-16 ENCOUNTER — Ambulatory Visit (INDEPENDENT_AMBULATORY_CARE_PROVIDER_SITE_OTHER): Payer: 59

## 2016-01-16 DIAGNOSIS — T63441D Toxic effect of venom of bees, accidental (unintentional), subsequent encounter: Secondary | ICD-10-CM | POA: Diagnosis not present

## 2016-01-25 ENCOUNTER — Ambulatory Visit (INDEPENDENT_AMBULATORY_CARE_PROVIDER_SITE_OTHER): Payer: 59 | Admitting: *Deleted

## 2016-01-25 DIAGNOSIS — T63441D Toxic effect of venom of bees, accidental (unintentional), subsequent encounter: Secondary | ICD-10-CM

## 2016-01-30 ENCOUNTER — Ambulatory Visit (INDEPENDENT_AMBULATORY_CARE_PROVIDER_SITE_OTHER): Payer: 59

## 2016-01-30 DIAGNOSIS — T63441D Toxic effect of venom of bees, accidental (unintentional), subsequent encounter: Secondary | ICD-10-CM | POA: Diagnosis not present

## 2016-02-04 ENCOUNTER — Ambulatory Visit (INDEPENDENT_AMBULATORY_CARE_PROVIDER_SITE_OTHER): Payer: 59

## 2016-02-04 DIAGNOSIS — T63441D Toxic effect of venom of bees, accidental (unintentional), subsequent encounter: Secondary | ICD-10-CM

## 2016-02-13 DIAGNOSIS — T63441D Toxic effect of venom of bees, accidental (unintentional), subsequent encounter: Secondary | ICD-10-CM | POA: Diagnosis not present

## 2016-02-14 DIAGNOSIS — T63441D Toxic effect of venom of bees, accidental (unintentional), subsequent encounter: Secondary | ICD-10-CM | POA: Diagnosis not present

## 2016-02-15 ENCOUNTER — Ambulatory Visit (INDEPENDENT_AMBULATORY_CARE_PROVIDER_SITE_OTHER): Payer: 59 | Admitting: *Deleted

## 2016-02-15 DIAGNOSIS — T63441D Toxic effect of venom of bees, accidental (unintentional), subsequent encounter: Secondary | ICD-10-CM

## 2016-02-21 ENCOUNTER — Ambulatory Visit (INDEPENDENT_AMBULATORY_CARE_PROVIDER_SITE_OTHER): Payer: 59

## 2016-02-21 DIAGNOSIS — J309 Allergic rhinitis, unspecified: Secondary | ICD-10-CM

## 2016-03-06 ENCOUNTER — Ambulatory Visit (INDEPENDENT_AMBULATORY_CARE_PROVIDER_SITE_OTHER): Payer: 59

## 2016-03-06 DIAGNOSIS — T63441D Toxic effect of venom of bees, accidental (unintentional), subsequent encounter: Secondary | ICD-10-CM | POA: Diagnosis not present

## 2016-03-13 ENCOUNTER — Ambulatory Visit (INDEPENDENT_AMBULATORY_CARE_PROVIDER_SITE_OTHER): Payer: 59

## 2016-03-13 DIAGNOSIS — T63441D Toxic effect of venom of bees, accidental (unintentional), subsequent encounter: Secondary | ICD-10-CM | POA: Diagnosis not present

## 2016-03-26 ENCOUNTER — Ambulatory Visit (INDEPENDENT_AMBULATORY_CARE_PROVIDER_SITE_OTHER): Payer: 59 | Admitting: *Deleted

## 2016-03-26 DIAGNOSIS — T63441D Toxic effect of venom of bees, accidental (unintentional), subsequent encounter: Secondary | ICD-10-CM | POA: Diagnosis not present

## 2016-04-04 DIAGNOSIS — Z6825 Body mass index (BMI) 25.0-25.9, adult: Secondary | ICD-10-CM | POA: Diagnosis not present

## 2016-04-04 DIAGNOSIS — L989 Disorder of the skin and subcutaneous tissue, unspecified: Secondary | ICD-10-CM | POA: Diagnosis not present

## 2016-04-15 ENCOUNTER — Encounter: Payer: Self-pay | Admitting: Allergy and Immunology

## 2016-04-15 ENCOUNTER — Ambulatory Visit (INDEPENDENT_AMBULATORY_CARE_PROVIDER_SITE_OTHER): Payer: 59 | Admitting: Allergy and Immunology

## 2016-04-15 ENCOUNTER — Ambulatory Visit (INDEPENDENT_AMBULATORY_CARE_PROVIDER_SITE_OTHER): Payer: 59 | Admitting: *Deleted

## 2016-04-15 VITALS — BP 120/70 | HR 74 | Resp 16 | Ht 68.0 in | Wt 176.6 lb

## 2016-04-15 DIAGNOSIS — R062 Wheezing: Secondary | ICD-10-CM

## 2016-04-15 DIAGNOSIS — J309 Allergic rhinitis, unspecified: Secondary | ICD-10-CM

## 2016-04-15 DIAGNOSIS — Z91038 Other insect allergy status: Secondary | ICD-10-CM

## 2016-04-15 DIAGNOSIS — Z91030 Bee allergy status: Secondary | ICD-10-CM

## 2016-04-15 HISTORY — DX: Wheezing: R06.2

## 2016-04-15 NOTE — Progress Notes (Signed)
    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  He was last seen in this clinic in June 2016.  He has not had accidental stings in the interval since his visit last year and has tolerated venom immunotherapy buildup/maintenance injections without complications.  He carries epinephrine autoinjector 2 pack, diphenhydramine, and his albuterol HFA inhaler in case of accidental sting.  He experiences occasional dyspnea and chest tightness with vigorous exercise but does not experience rest symptoms or nocturnal awakenings due to lower respiratory symptoms.   Assessment and plan: Hymenoptera allergy  Continue venom immunotherapy as prescribed and as tolerated.  Continue to have access to albuterol HFA, diphenhydramine, and epinephrine autoinjector 2 pack in case of accidental injection.  Dyspnea/wheezing  Continue albuterol every 4-6 hours as needed.  Subjective and objective measures of pulmonary function will be followed and the treatment plan will be adjusted accordingly.   Diagnositics: Spirometry:  Normal with an FEV1 of 97% predicted.  Please see scanned spirometry results for details.    Physical examination: Blood pressure 120/70, pulse 74, resp. rate 16, height 5\' 8"  (1.727 m), weight 176 lb 9.6 oz (80.105 kg).  General: Alert, interactive, in no acute distress. Lungs: Clear to auscultation without wheezing, rhonchi or rales. CV: Normal S1, S2 without murmurs. Skin: Warm and dry, without lesions or rashes.  The following portions of the patient's history were reviewed and updated as appropriate: allergies, current medications, past family history,  past medical history, past social history, past surgical history and problem list.    Medication List       This list is accurate as of: 04/15/16 12:12 PM.  Always use your most recent med list.               diphenhydrAMINE 25 MG tablet  Commonly known as:  BENADRYL  Take 50 mg by mouth every 6 (six) hours as needed.     EPIPEN 0.3 mg/0.3 mL Devi  Generic drug:  EPINEPHrine  Inject 0.3 mg into the muscle once.     fexofenadine 180 MG tablet  Commonly known as:  ALLEGRA  Take 180 mg by mouth daily.     ibuprofen 200 MG tablet  Commonly known as:  ADVIL,MOTRIN  Take 400 mg by mouth every 6 (six) hours as needed. For pain     PROAIR RESPICLICK 108 (90 Base) MCG/ACT Aepb  Generic drug:  Albuterol Sulfate  Inhale 2 puffs into the lungs as needed (every 4-6 hours for cough or wheeze).        Allergies  Allergen Reactions  . Bee Venom Anaphylaxis    I appreciate the opportunity to take part in Clifford Hall's care. Please do not hesitate to contact me with questions.  Sincerely,   R. Jorene Guestarter Vauda Salvucci, MD

## 2016-04-15 NOTE — Patient Instructions (Addendum)
Hymenoptera allergy  Continue venom immunotherapy as prescribed and as tolerated.  Continue to have access to albuterol HFA, diphenhydramine, and epinephrine autoinjector 2 pack in case of accidental injection.  Dyspnea/wheezing  Continue albuterol every 4-6 hours as needed.  Subjective and objective measures of pulmonary function will be followed and the treatment plan will be adjusted accordingly.    Return in about 1 year (around 04/15/2017), or if symptoms worsen or fail to improve.

## 2016-04-15 NOTE — Assessment & Plan Note (Addendum)
   Continue venom immunotherapy as prescribed and as tolerated.  Continue to have access to albuterol HFA, diphenhydramine, and epinephrine autoinjector 2 pack in case of accidental injection.

## 2016-04-15 NOTE — Assessment & Plan Note (Signed)
   Continue albuterol every 4-6 hours as needed.  Subjective and objective measures of pulmonary function will be followed and the treatment plan will be adjusted accordingly. 

## 2016-06-02 ENCOUNTER — Telehealth: Payer: Self-pay

## 2016-06-02 ENCOUNTER — Ambulatory Visit (INDEPENDENT_AMBULATORY_CARE_PROVIDER_SITE_OTHER): Payer: 59

## 2016-06-02 DIAGNOSIS — Z91038 Other insect allergy status: Secondary | ICD-10-CM

## 2016-06-02 DIAGNOSIS — Z91030 Bee allergy status: Secondary | ICD-10-CM

## 2016-06-02 NOTE — Telephone Encounter (Signed)
[place] a [place] a [place] now and missed his last MV venom injection. Do you want me to give him the full strength or cut his dose down as it has been since June 27th since his last immunotherapy.  Please advise

## 2016-06-02 NOTE — Telephone Encounter (Signed)
Blood the dose.  Get the first half of the dose and then observed for 30 minutes.  If no problems, give the second half the dose, and then have the patient wait an additional 30 minutes prior to leaving.  Thanks.

## 2016-06-20 DIAGNOSIS — M546 Pain in thoracic spine: Secondary | ICD-10-CM | POA: Diagnosis not present

## 2016-07-02 DIAGNOSIS — T63441D Toxic effect of venom of bees, accidental (unintentional), subsequent encounter: Secondary | ICD-10-CM | POA: Diagnosis not present

## 2016-07-03 ENCOUNTER — Ambulatory Visit (INDEPENDENT_AMBULATORY_CARE_PROVIDER_SITE_OTHER): Payer: 59 | Admitting: *Deleted

## 2016-07-03 DIAGNOSIS — T63441D Toxic effect of venom of bees, accidental (unintentional), subsequent encounter: Secondary | ICD-10-CM

## 2016-07-09 DIAGNOSIS — Z6825 Body mass index (BMI) 25.0-25.9, adult: Secondary | ICD-10-CM | POA: Diagnosis not present

## 2016-07-09 DIAGNOSIS — J069 Acute upper respiratory infection, unspecified: Secondary | ICD-10-CM | POA: Diagnosis not present

## 2016-08-12 ENCOUNTER — Ambulatory Visit (INDEPENDENT_AMBULATORY_CARE_PROVIDER_SITE_OTHER): Payer: 59

## 2016-08-12 DIAGNOSIS — T63441D Toxic effect of venom of bees, accidental (unintentional), subsequent encounter: Secondary | ICD-10-CM

## 2016-09-18 ENCOUNTER — Ambulatory Visit (INDEPENDENT_AMBULATORY_CARE_PROVIDER_SITE_OTHER): Payer: 59

## 2016-09-18 DIAGNOSIS — T63441D Toxic effect of venom of bees, accidental (unintentional), subsequent encounter: Secondary | ICD-10-CM

## 2016-10-28 ENCOUNTER — Ambulatory Visit (INDEPENDENT_AMBULATORY_CARE_PROVIDER_SITE_OTHER): Payer: 59 | Admitting: *Deleted

## 2016-10-28 DIAGNOSIS — T63441D Toxic effect of venom of bees, accidental (unintentional), subsequent encounter: Secondary | ICD-10-CM

## 2016-11-21 ENCOUNTER — Ambulatory Visit (INDEPENDENT_AMBULATORY_CARE_PROVIDER_SITE_OTHER): Payer: 59

## 2016-11-21 DIAGNOSIS — T63441D Toxic effect of venom of bees, accidental (unintentional), subsequent encounter: Secondary | ICD-10-CM

## 2016-12-31 ENCOUNTER — Ambulatory Visit (INDEPENDENT_AMBULATORY_CARE_PROVIDER_SITE_OTHER): Payer: 59

## 2016-12-31 ENCOUNTER — Other Ambulatory Visit: Payer: Self-pay

## 2016-12-31 DIAGNOSIS — T63441D Toxic effect of venom of bees, accidental (unintentional), subsequent encounter: Secondary | ICD-10-CM | POA: Diagnosis not present

## 2016-12-31 MED ORDER — EPINEPHRINE 0.3 MG/0.3ML IJ SOAJ: 0.3000 mg | Freq: Once | INTRAMUSCULAR | 2 refills | Status: AC

## 2017-01-02 DIAGNOSIS — M25521 Pain in right elbow: Secondary | ICD-10-CM | POA: Diagnosis not present

## 2017-02-02 ENCOUNTER — Ambulatory Visit (INDEPENDENT_AMBULATORY_CARE_PROVIDER_SITE_OTHER): Payer: 59 | Admitting: *Deleted

## 2017-02-02 DIAGNOSIS — T63441D Toxic effect of venom of bees, accidental (unintentional), subsequent encounter: Secondary | ICD-10-CM

## 2017-02-24 ENCOUNTER — Encounter: Payer: Self-pay | Admitting: *Deleted

## 2017-02-24 NOTE — Progress Notes (Signed)
This encounter was created in error - please disregard.

## 2017-03-16 ENCOUNTER — Ambulatory Visit
Admission: EM | Admit: 2017-03-16 | Discharge: 2017-03-16 | Disposition: A | Discharge status: Home / Self Care | Payer: 59 | Attending: Family Medicine | Admitting: Family Medicine

## 2017-03-16 ENCOUNTER — Ambulatory Visit (INDEPENDENT_AMBULATORY_CARE_PROVIDER_SITE_OTHER): Payer: 59

## 2017-03-16 ENCOUNTER — Encounter: Payer: Self-pay | Admitting: *Deleted

## 2017-03-16 DIAGNOSIS — Z23 Encounter for immunization: Secondary | ICD-10-CM

## 2017-03-16 DIAGNOSIS — S61210A Laceration without foreign body of right index finger without damage to nail, initial encounter: Secondary | ICD-10-CM

## 2017-03-16 DIAGNOSIS — S61218A Laceration without foreign body of other finger without damage to nail, initial encounter: Secondary | ICD-10-CM | POA: Diagnosis not present

## 2017-03-16 MED ORDER — CEPHALEXIN 500 MG PO CAPS: 500.0000 mg | ORAL_CAPSULE | Freq: Four times a day (QID) | ORAL | 0 refills | Status: DC

## 2017-03-16 MED ORDER — TETANUS-DIPHTH-ACELL PERTUSSIS 5-2.5-18.5 LF-MCG/0.5 IM SUSP
0.5000 mL | Freq: Once | INTRAMUSCULAR | Status: AC
  Administered 2017-03-16: 0.5 mL via INTRAMUSCULAR

## 2017-03-16 NOTE — ED Triage Notes (Signed)
Patient lacerated his right index finger while working on his lawnmower this AM.

## 2017-03-16 NOTE — ED Provider Notes (Signed)
[facility] ____________________________________________  Time seen: Approximately 11:26 AM  I have reviewed the triage vital signs and the nursing notes.   HISTORY  Chief Complaint Laceration   HPI Clifford Hall is a 31 y.o. male  presenting for evaluation of right hand index finger laceration that occurred just prior to arrival. Patient reports that he was working on his lumbar at home and tightening the bolts. Patient states that his hand slipped and hit the blade with his index finger causing laceration. States mild pain at this time at laceration site. Denies any other pain or injury. Denies any decreased range of motion, paresthesias or pain radiation. Denies any other pain or injury to right hand. Reports otherwise feels well. States right-handed. Unsure of last tetanus immunization. Denies head injury, loss of consciousness or fall to the ground. Denies other complaints.  Denies chest pain, shortness of breath, or rash. Denies recent sickness. Denies recent antibiotic use.    Past Medical History:  Diagnosis Date  . Hymenoptera allergy     Patient Active Problem List   Diagnosis Date Noted  . Dyspnea/wheezing 04/15/2016  . 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 I&D EXTREMITY  09/28/2012   Procedure: IRRIGATION AND DEBRIDEMENT EXTREMITY;  Surgeon: Dominica Severin, MD;  Location: [facility] OR;  Service: Orthopedics;  Laterality: Left;  . NERVE AND TENDON REPAIR  09/28/2012   Procedure: NERVE AND TENDON REPAIR;  Surgeon: Dominica Severin, MD;  Location: [facility] OR;  Service: Orthopedics;  Laterality: Left;     No current facility-administered medications for this encounter.   Current Outpatient Prescriptions:  .  ibuprofen (ADVIL,MOTRIN) 200 MG tablet, Take 400 mg by mouth every 6 (six) hours as needed. For pain, Disp: , Rfl:  .  Albuterol Sulfate (PROAIR RESPICLICK) 108 (90 BASE) MCG/ACT AEPB, Inhale 2 puffs  into the lungs as needed (every 4-6 hours for cough or wheeze)., Disp: , Rfl:  .  cephALEXin (KEFLEX) 500 MG capsule, Take 1 capsule (500 mg total) by mouth 4 (four) times daily., Disp: 20 capsule, Rfl: 0 .  diphenhydrAMINE (BENADRYL) 25 MG tablet, Take 50 mg by mouth every 6 (six) hours as needed., Disp: , Rfl:  .  fexofenadine (ALLEGRA) 180 MG tablet, Take 180 mg by mouth daily., Disp: , Rfl:   Allergies Bee venom  Family History  Problem Relation Age of Onset  . Kidney Stones Father   . Thyroid disease Sister   . Cancer Maternal Aunt   . Thyroid disease Maternal Aunt   . Heart disease Maternal Grandfather   . Stroke Maternal Grandfather   . Kidney Stones Maternal Grandfather   . Cancer Paternal Grandfather   . Heart disease Paternal Grandfather   . Kidney Stones Paternal Grandfather     Social History Social History  Substance Use Topics  . Smoking status: Never Smoker  . Smokeless tobacco: Never Used  . Alcohol use Yes     Comment: Occationally - once or twice monthly    Review of Systems Constitutional: No fever/chills Cardiovascular: Denies chest pain. Respiratory: Denies shortness of breath. Gastrointestinal: No abdominal pain.   Musculoskeletal: Negative for back pain. Skin:As above.  ____________________________________________   PHYSICAL EXAM:  VITAL SIGNS: ED Triage Vitals  Enc Vitals Group     BP 03/16/17 1055 120/69     Pulse Rate 03/16/17 1055 75     Resp 03/16/17 1055 16     Temp 03/16/17 1055 98.1 F (36.7  C)     Temp Source 03/16/17 1055 Oral     SpO2 03/16/17 1055 99 %     Weight 03/16/17 1057 175 lb (79.4 kg)     Height 03/16/17 1057 5\' 8"  (1.727 m)     Head Circumference --      Peak Flow --      Pain Score 03/16/17 1057 3     Pain Loc --      Pain Edu? --      Excl. in GC? --     Constitutional: Alert and oriented. Well appearing and in no acute distress. Cardiovascular: Normal rate, regular rhythm. Grossly normal heart sounds.   Good peripheral circulation. Respiratory: Normal respiratory effort without tachypnea nor retractions. Breath sounds are clear and equal bilaterally. No wheezes, rales, rhonchi. Musculoskeletal: Steady gait.  Neurologic:  Normal speech and language. Speech is normal. No gait instability.  Skin:  Skin is warm, dry. Except: Right hand lateral middle phalanx of index finger 2.5 cm laceration present with mild active bleeding and partial extension over PIP joint, linear, no foreign body visualized, no bone or tendon visualized, mild tenderness to direct palpation, right index finger with full range of motion present, no motor or tendon deficits and normal distal sensation and capillary refill. Right hand otherwise nontender. Psychiatric: Mood and affect are normal. Speech and behavior are normal. Patient exhibits appropriate insight and judgment   ___________________________________________   LABS (all labs ordered are listed, but only abnormal results are displayed)  Labs Reviewed - No data to display  RADIOLOGY  Dg Finger Index Right  Result Date: 03/16/2017 CLINICAL DATA:  Index finger laceration with lawnmower blade today. Initial encounter. EXAM: RIGHT INDEX FINGER 2+V COMPARISON:  None. FINDINGS: The mineralization and alignment are normal. There is no evidence of acute fracture or dislocation. The joint spaces are maintained. No evidence of foreign body or soft tissue emphysema. Possible mild soft tissue swelling. IMPRESSION: No acute osseous findings or foreign body demonstrated. Electronically Signed   By: Carey BullocksWilliam  Veazey M.D.   On: 03/16/2017 11:42   ____________________________________________   PROCEDURES Procedures   Procedure(s) performed:  Procedure explained and verbal consent obtained. Consent: Verbal consent obtained. Written consent not obtained. Risks and benefits: risks, benefits and alternatives were discussed Patient identity confirmed: verbally with patient and  hospital-assigned identification number  Consent given by: patient   Laceration Repair Location: right index finger Length: 2.5cm Foreign bodies: no foreign bodies Tendon involvement: none Nerve involvement: none Preparation: Patient was prepped and draped in the usual sterile fashion. Anesthesia with 1% Lidocaine 4 mls Irrigation solution: saline and betadine Irrigation method: jet lavage Amount of cleaning: copious Repaired with 4-0 nylon  Number of sutures: 3 Technique: simple interrupted  Approximation: loose Patient tolerate well. Wound well approximated post repair.  Antibiotic ointment and dressing applied, finger splint applied.  Wound care instructions provided.  Observe for any signs of infection or other problems.     INITIAL IMPRESSION / ASSESSMENT AND PLAN / ED COURSE  Pertinent labs & imaging results that were available during my care of the patient were reviewed by me and considered in my medical decision making (see chart for details).  Well-appearing patient. No acute distress. Presents for laceration repair to right index finger mechanical injury prior to arrival. Denies other pain or complaints. Right index finger x-ray per radiologist negative. Wound copiously irrigated and repaired. As patient reports that this is from a lawnmower and he had grease on his hands during  the laceration, will start patient oral Keflex 5 days. Encourage wound care and wound monitoring. Finger splint applied for support of sutures. Discussed return to urgent care in 7-10 days for suture removal.Discussed indication, risks and benefits of medications with patient. Tetanus immunization updated.  Discussed follow up with Primary care physician this week. Disctussed follow up and return parameters including no resolution or any worsening concerns. Patient verbalized understanding and agreed to plan.   ____________________________________________   FINAL CLINICAL IMPRESSION(S) / ED  DIAGNOSES  Final diagnoses:  Laceration of right index finger without foreign body without damage to nail, initial encounter     Discharge Medication List as of 03/16/2017 12:06 PM    START taking these medications   Details  cephALEXin (KEFLEX) 500 MG capsule Take 1 capsule (500 mg total) by mouth 4 (four) times daily., Starting Mon 03/16/2017, Normal        Note: This dictation was prepared with Dragon dictation along with smaller phrase technology. Any transcriptional errors that result from this process are unintentional.         Renford Dills, NP 03/16/17 1226

## 2017-03-16 NOTE — Discharge Instructions (Signed)
Take medication as prescribed. Rest. Drink plenty of fluids. Keep clean and dry. Use finger splint for 3 days as discussed.   Return to urgent care in 7-10 days for suture removal.   Follow up with your primary care physician this week as needed. Return to Urgent care for new or worsening concerns.

## 2017-03-23 ENCOUNTER — Ambulatory Visit (INDEPENDENT_AMBULATORY_CARE_PROVIDER_SITE_OTHER): Payer: 59 | Admitting: *Deleted

## 2017-03-23 DIAGNOSIS — T63441D Toxic effect of venom of bees, accidental (unintentional), subsequent encounter: Secondary | ICD-10-CM

## 2017-04-21 ENCOUNTER — Ambulatory Visit (INDEPENDENT_AMBULATORY_CARE_PROVIDER_SITE_OTHER): Payer: 59

## 2017-04-21 DIAGNOSIS — T63441D Toxic effect of venom of bees, accidental (unintentional), subsequent encounter: Secondary | ICD-10-CM

## 2017-05-19 ENCOUNTER — Ambulatory Visit (INDEPENDENT_AMBULATORY_CARE_PROVIDER_SITE_OTHER): Payer: 59 | Admitting: Allergy and Immunology

## 2017-05-19 ENCOUNTER — Encounter: Payer: Self-pay | Admitting: Allergy and Immunology

## 2017-05-19 ENCOUNTER — Ambulatory Visit (INDEPENDENT_AMBULATORY_CARE_PROVIDER_SITE_OTHER): Payer: 59 | Admitting: *Deleted

## 2017-05-19 VITALS — BP 106/62 | HR 66 | Resp 14 | Ht 67.0 in | Wt 176.0 lb

## 2017-05-19 DIAGNOSIS — Z91030 Bee allergy status: Secondary | ICD-10-CM

## 2017-05-19 DIAGNOSIS — T63441D Toxic effect of venom of bees, accidental (unintentional), subsequent encounter: Secondary | ICD-10-CM | POA: Diagnosis not present

## 2017-05-19 DIAGNOSIS — R062 Wheezing: Secondary | ICD-10-CM | POA: Diagnosis not present

## 2017-05-19 DIAGNOSIS — Z91038 Other insect allergy status: Secondary | ICD-10-CM | POA: Diagnosis not present

## 2017-05-19 MED ORDER — EPINEPHRINE 0.3 MG/0.3ML IJ SOAJ: 0.3000 mg | Freq: Once | INTRAMUSCULAR | 1 refills | Status: AC

## 2017-05-19 MED ORDER — ALBUTEROL SULFATE 108 (90 BASE) MCG/ACT IN AEPB: 2.0000 | INHALATION_SPRAY | RESPIRATORY_TRACT | 1 refills | Status: DC | PRN

## 2017-05-19 NOTE — Assessment & Plan Note (Addendum)
   Continue albuterol every 4-6 hours if needed.  A refill prescription has been provided for albuterol HFA.

## 2017-05-19 NOTE — Assessment & Plan Note (Signed)
   Continue venom immunotherapy as prescribed and as tolerated.  We will increase the injection interval to every 6 weeks.  Continue to have access to albuterol HFA, diphenhydramine, and epinephrine autoinjector 2 pack in case of accidental injection.  A prescription has been provided for AuviQ 0.3mg  auto-injector 2 pack, if needed.

## 2017-05-19 NOTE — Progress Notes (Signed)
Follow-up Note  RE: Clifford Hall MRN: [id number] DOB: 07-08-1986 Date of Office Visit: 05/19/2017  Primary care provider: Dortha KernBliss, Laura K, MD Referring provider: Dortha KernBliss, Laura K, MD  History of present illness: Clifford Hall is a 31 y.o. 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  He was last seen in this clinic in June 2017.  Over the past 2 years he has not been stung by a flying insect despite spending a significant amount of time outdoors.  He is receiving venom immunotherapy injections every 4 weeks without problems or complications.  He is interested in stretching the interval out to every 6 weeks if possible.  He needs a refill for his epinephrine autoinjector.  He rarely requires albuterol rescue, typically with vigorous exertion while working outdoors.  He needs a refill for his albuterol HFA inhaler.   Assessment and plan: Hymenoptera venom allergy  Continue venom immunotherapy as prescribed and as tolerated.  We will increase the injection interval to every 6 weeks.  Continue to have access to albuterol HFA, diphenhydramine, and epinephrine autoinjector 2 pack in case of accidental injection.  A prescription has been provided for AuviQ 0.3mg  auto-injector 2 pack, if needed.  Dyspnea/wheezing  Continue albuterol every 4-6 hours if needed.  A refill prescription has been provided for albuterol HFA.   Meds ordered this encounter  Medications  . Albuterol Sulfate (PROAIR RESPICLICK) 108 (90 Base) MCG/ACT AEPB    Sig: Inhale 2 puffs into the lungs as needed (every 4-6 hours for cough or wheeze).    Dispense:  1 each    Refill:  1  . EPINEPHrine (AUVI-Q) 0.3 mg/0.3 mL IJ SOAJ injection    Sig: Inject 0.3 mLs (0.3 mg total) into the muscle once.    Dispense:  2 Device    Refill:  1    [phone]    Diagnostics: Spirometry:  Normal with an FEV1 of 107% predicted.   Please see scanned spirometry results for details.    Physical examination: Blood pressure 106/62, pulse 66, resp. rate 14, height 5\' 7"  (1.702 m), weight 176 lb (79.8 kg), SpO2 97 %.  General: Alert, interactive, in no acute distress. Neck: Supple without lymphadenopathy. Lungs: Clear to auscultation without wheezing, rhonchi or rales. CV: Normal S1, S2 without murmurs. Skin: Warm and dry, without lesions or rashes.  The following portions of the patient's history were reviewed and updated as appropriate: allergies, current medications, past family history, past medical history, past social history, past surgical history and problem list.  Allergies as of 05/19/2017      Reactions   Bee Venom Anaphylaxis      Medication List       Accurate as of 05/19/17  3:03 PM. Always use your most recent med list.          Albuterol Sulfate 108 (90 Base) MCG/ACT Aepb Commonly known as:  PROAIR RESPICLICK Inhale 2 puffs into the lungs as needed (every 4-6 hours for cough or wheeze).   diphenhydrAMINE 25 MG tablet Commonly known as:  BENADRYL Take 50 mg by mouth every 6 (six) hours as needed.   EPINEPHrine 0.3 mg/0.3 mL Soaj injection Commonly known as:  AUVI-Q Inject 0.3 mLs (0.3 mg total) into the muscle once.   ibuprofen 200 MG tablet Commonly known as:  ADVIL,MOTRIN Take 400 mg by mouth every 6 (six) hours as needed. For pain  Allergies  Allergen Reactions  . Bee Venom Anaphylaxis    I appreciate the opportunity to take part in Clifford Hall's care. Please do not hesitate to contact me with questions.  Sincerely,   R. Jorene Guestarter Laydon Martis, MD

## 2017-05-19 NOTE — Patient Instructions (Addendum)
Hymenoptera venom allergy  Continue venom immunotherapy as prescribed and as tolerated.  We will increase the injection interval to every 6 weeks.  Continue to have access to albuterol HFA, diphenhydramine, and epinephrine autoinjector 2 pack in case of accidental injection.  A [facility]  [facility]-inject[facility] 2 pack, if needed.  Dyspnea/wheezing  Continue albuterol every 4-6 hours if needed.  A refill prescription has been provided for albuterol HFA.   Return in about 1 year (around 05/19/2018), or if symptoms worsen or fail to improve.

## 2017-07-08 ENCOUNTER — Ambulatory Visit (INDEPENDENT_AMBULATORY_CARE_PROVIDER_SITE_OTHER): Payer: 59

## 2017-07-08 DIAGNOSIS — T63441D Toxic effect of venom of bees, accidental (unintentional), subsequent encounter: Secondary | ICD-10-CM | POA: Diagnosis not present

## 2017-08-07 DIAGNOSIS — L739 Follicular disorder, unspecified: Secondary | ICD-10-CM | POA: Diagnosis not present

## 2017-08-07 DIAGNOSIS — Z23 Encounter for immunization: Secondary | ICD-10-CM | POA: Diagnosis not present

## 2017-09-09 ENCOUNTER — Ambulatory Visit (INDEPENDENT_AMBULATORY_CARE_PROVIDER_SITE_OTHER): Payer: 59

## 2017-09-09 DIAGNOSIS — T63441D Toxic effect of venom of bees, accidental (unintentional), subsequent encounter: Secondary | ICD-10-CM | POA: Diagnosis not present

## 2017-09-14 ENCOUNTER — Ambulatory Visit (INDEPENDENT_AMBULATORY_CARE_PROVIDER_SITE_OTHER): Payer: 59

## 2017-09-14 DIAGNOSIS — T63441D Toxic effect of venom of bees, accidental (unintentional), subsequent encounter: Secondary | ICD-10-CM | POA: Diagnosis not present

## 2017-09-25 DIAGNOSIS — Z6826 Body mass index (BMI) 26.0-26.9, adult: Secondary | ICD-10-CM | POA: Diagnosis not present

## 2017-09-25 DIAGNOSIS — F4322 Adjustment disorder with anxiety: Secondary | ICD-10-CM | POA: Diagnosis not present

## 2017-09-25 DIAGNOSIS — F5102 Adjustment insomnia: Secondary | ICD-10-CM | POA: Diagnosis not present

## 2017-11-05 ENCOUNTER — Ambulatory Visit (INDEPENDENT_AMBULATORY_CARE_PROVIDER_SITE_OTHER): Payer: 59 | Admitting: *Deleted

## 2017-11-05 DIAGNOSIS — T63441D Toxic effect of venom of bees, accidental (unintentional), subsequent encounter: Secondary | ICD-10-CM | POA: Diagnosis not present

## 2017-12-16 DIAGNOSIS — Z23 Encounter for immunization: Secondary | ICD-10-CM | POA: Diagnosis not present

## 2017-12-16 DIAGNOSIS — Z0000 Encounter for general adult medical examination without abnormal findings: Secondary | ICD-10-CM | POA: Diagnosis not present

## 2017-12-16 DIAGNOSIS — Z13220 Encounter for screening for lipoid disorders: Secondary | ICD-10-CM | POA: Diagnosis not present

## 2017-12-16 DIAGNOSIS — R634 Abnormal weight loss: Secondary | ICD-10-CM | POA: Diagnosis not present

## 2017-12-16 DIAGNOSIS — Z6825 Body mass index (BMI) 25.0-25.9, adult: Secondary | ICD-10-CM | POA: Diagnosis not present

## 2017-12-16 DIAGNOSIS — Z131 Encounter for screening for diabetes mellitus: Secondary | ICD-10-CM | POA: Diagnosis not present

## 2017-12-16 DIAGNOSIS — G4489 Other headache syndrome: Secondary | ICD-10-CM | POA: Diagnosis not present

## 2017-12-16 DIAGNOSIS — F4322 Adjustment disorder with anxiety: Secondary | ICD-10-CM | POA: Diagnosis not present

## 2017-12-17 ENCOUNTER — Ambulatory Visit: Payer: 59

## 2017-12-21 ENCOUNTER — Ambulatory Visit (INDEPENDENT_AMBULATORY_CARE_PROVIDER_SITE_OTHER): Payer: 59 | Admitting: *Deleted

## 2017-12-21 DIAGNOSIS — T63441D Toxic effect of venom of bees, accidental (unintentional), subsequent encounter: Secondary | ICD-10-CM

## 2018-02-12 ENCOUNTER — Ambulatory Visit (INDEPENDENT_AMBULATORY_CARE_PROVIDER_SITE_OTHER): Payer: 59

## 2018-02-12 DIAGNOSIS — T63441D Toxic effect of venom of bees, accidental (unintentional), subsequent encounter: Secondary | ICD-10-CM | POA: Diagnosis not present

## 2018-03-25 ENCOUNTER — Ambulatory Visit (INDEPENDENT_AMBULATORY_CARE_PROVIDER_SITE_OTHER): Payer: 59

## 2018-03-25 DIAGNOSIS — T63441D Toxic effect of venom of bees, accidental (unintentional), subsequent encounter: Secondary | ICD-10-CM | POA: Diagnosis not present

## 2018-03-26 ENCOUNTER — Ambulatory Visit: Payer: Self-pay

## 2018-04-30 ENCOUNTER — Ambulatory Visit (INDEPENDENT_AMBULATORY_CARE_PROVIDER_SITE_OTHER): Payer: 59

## 2018-04-30 DIAGNOSIS — T63441D Toxic effect of venom of bees, accidental (unintentional), subsequent encounter: Secondary | ICD-10-CM

## 2018-06-10 ENCOUNTER — Ambulatory Visit (INDEPENDENT_AMBULATORY_CARE_PROVIDER_SITE_OTHER): Payer: 59 | Admitting: *Deleted

## 2018-06-10 DIAGNOSIS — T63441D Toxic effect of venom of bees, accidental (unintentional), subsequent encounter: Secondary | ICD-10-CM

## 2018-06-14 ENCOUNTER — Ambulatory Visit: Payer: 59 | Admitting: Allergy and Immunology

## 2018-06-22 ENCOUNTER — Ambulatory Visit: Payer: 59 | Admitting: Allergy and Immunology

## 2018-06-22 DIAGNOSIS — J309 Allergic rhinitis, unspecified: Secondary | ICD-10-CM

## 2018-07-22 ENCOUNTER — Ambulatory Visit: Payer: 59

## 2018-08-02 ENCOUNTER — Ambulatory Visit: Payer: 59

## 2018-08-02 ENCOUNTER — Ambulatory Visit: Payer: 59 | Admitting: Allergy and Immunology

## 2018-08-02 ENCOUNTER — Encounter: Payer: Self-pay | Admitting: Allergy and Immunology

## 2018-08-02 VITALS — BP 100/60 | HR 76 | Ht 68.6 in | Wt 168.2 lb

## 2018-08-02 DIAGNOSIS — Z91030 Bee allergy status: Secondary | ICD-10-CM

## 2018-08-02 DIAGNOSIS — R062 Wheezing: Secondary | ICD-10-CM

## 2018-08-02 DIAGNOSIS — Z91038 Other insect allergy status: Secondary | ICD-10-CM

## 2018-08-02 DIAGNOSIS — T63441D Toxic effect of venom of bees, accidental (unintentional), subsequent encounter: Secondary | ICD-10-CM | POA: Diagnosis not present

## 2018-08-02 MED ORDER — ALBUTEROL SULFATE 108 (90 BASE) MCG/ACT IN AEPB: 2.0000 | INHALATION_SPRAY | RESPIRATORY_TRACT | 1 refills | Status: DC | PRN

## 2018-08-02 MED ORDER — EPINEPHRINE 0.3 MG/0.3ML IJ SOAJ: 0.3000 mg | Freq: Once | INTRAMUSCULAR | 0 refills | Status: AC

## 2018-08-02 NOTE — Progress Notes (Signed)
Follow-up Note  RE: Clifford Hall MRN: [id number] DOB: April 29, 1986 Date of Office Visit: 08/02/2018  Primary care provider: Dortha Kern, MD Referring provider: Dortha Kern, MD  History of present illness: Clifford Hall is a 32 y.o. male with hymenoptera venom hypersensitivity in venom immunotherapy and history of wheezing presented today for follow-up.  He was last seen in this clinic in July 2018. He reports that in early June of this year he was stung twice by what he believes to have been a bee.  He only developed a small hive at each of the locations where he was stung but did not experience systemic symptoms.  He reports that he has only required albuterol rescue 2 or 3 times over the past 2 years.  He notes that his primary care physician has auscultated wheezing on examination.  Assessment and plan: Hymenoptera venom allergy  Continue venom immunotherapy.   Continue to have access to albuterol HFA, diphenhydramine, and epinephrine autoinjector 2 pack in case of accidental injection.  A refill prescription has been provided for AuviQ 0.3mg  auto-injector 2 pack, if needed.  Dyspnea/wheezing  Continue albuterol every 4-6 hours if needed.  A refill prescription has been provided for albuterol HFA.   Meds ordered this encounter  Medications  . EPINEPHrine (AUVI-Q) 0.3 mg/0.3 mL IJ SOAJ injection    Sig: Inject 0.3 mLs (0.3 mg total) into the muscle once for 1 dose.    Dispense:  0.3 mL    Refill:  0  . Albuterol Sulfate (PROAIR RESPICLICK) 108 (90 Base) MCG/ACT AEPB    Sig: Inhale 2 puffs into the lungs as needed (every 4-6 hours for cough or wheeze).    Dispense:  1 each    Refill:  1    Diagnostics: Spirometry:  Normal with an FEV1 of 98% predicted.  Please see scanned spirometry results for details.    Physical examination: Blood pressure 100/60, pulse 76, height 5' 8.6" (1.742 m), weight 168 lb 3.2 oz (76.3 kg).  General: Alert,  interactive, in no acute distress. Neck: Supple without lymphadenopathy. Lungs: Clear to auscultation without wheezing, rhonchi or rales. CV: Normal S1, S2 without murmurs. Skin: Warm and dry, without lesions or rashes.  The following portions of the patient's history were reviewed and updated as appropriate: allergies, current medications, past family history, past medical history, past social history, past surgical history and problem list.  Allergies as of 08/02/2018      Reactions   Bee Venom Anaphylaxis      Medication List        Accurate as of 08/02/18  7:29 PM. Always use your most recent med list.          Albuterol Sulfate 108 (90 Base) MCG/ACT Aepb Inhale 2 puffs into the lungs as needed (every 4-6 hours for cough or wheeze).   diphenhydrAMINE 25 MG tablet Commonly known as:  BENADRYL Take 50 mg by mouth every 6 (six) hours as needed.   EPINEPHrine 0.3 mg/0.3 mL Soaj injection Commonly known as:  EPI-PEN Inject 0.3 mLs (0.3 mg total) into the muscle once for 1 dose.   ibuprofen 200 MG tablet Commonly known as:  ADVIL,MOTRIN Take 400 mg by mouth every 6 (six) hours as needed. For pain       Allergies  Allergen Reactions  . Bee Venom Anaphylaxis    I appreciate the opportunity to take part in Clifford Hall's care. Please do not hesitate to contact me with  questions.  Sincerely,   R. Edgar Frisk, MD

## 2018-08-02 NOTE — Assessment & Plan Note (Signed)
   Continue venom immunotherapy.   Continue to have access to albuterol HFA, diphenhydramine, and epinephrine autoinjector 2 pack in case of accidental injection.  A refill prescription has been provided for AuviQ 0.3mg  auto-injector 2 pack, if needed.

## 2018-08-02 NOTE — Patient Instructions (Signed)
Hymenoptera venom allergy  Continue venom immunotherapy.   Continue to have access to albuterol HFA, diphenhydramine, and epinephrine autoinjector 2 pack in case of accidental injection.  A refill prescription has been provided for AuviQ 0.3mg  auto-injector 2 pack, if needed.  Dyspnea/wheezing  Continue albuterol every 4-6 hours if needed.  A refill prescription has been provided for albuterol HFA.   Return in about 1 year (around 08/03/2019), or if symptoms worsen or fail to improve.

## 2018-08-02 NOTE — Assessment & Plan Note (Signed)
   Continue albuterol every 4-6 hours if needed.  A refill prescription has been provided for albuterol HFA. 

## 2018-08-03 NOTE — Addendum Note (Signed)
Addended by: Mliss Fritz I on: 08/03/2018 07:23 AM   Modules accepted: Orders

## 2018-09-14 ENCOUNTER — Ambulatory Visit (INDEPENDENT_AMBULATORY_CARE_PROVIDER_SITE_OTHER): Payer: 59 | Admitting: *Deleted

## 2018-09-14 DIAGNOSIS — T63441D Toxic effect of venom of bees, accidental (unintentional), subsequent encounter: Secondary | ICD-10-CM

## 2018-09-20 DIAGNOSIS — Z23 Encounter for immunization: Secondary | ICD-10-CM | POA: Diagnosis not present

## 2018-09-28 DIAGNOSIS — Z6826 Body mass index (BMI) 26.0-26.9, adult: Secondary | ICD-10-CM | POA: Diagnosis not present

## 2018-09-28 DIAGNOSIS — R05 Cough: Secondary | ICD-10-CM | POA: Diagnosis not present

## 2018-10-26 ENCOUNTER — Ambulatory Visit: Payer: 59

## 2018-10-28 ENCOUNTER — Ambulatory Visit: Payer: 59 | Admitting: Primary Care

## 2018-11-03 ENCOUNTER — Encounter: Payer: Self-pay | Admitting: Primary Care

## 2018-11-03 ENCOUNTER — Ambulatory Visit (INDEPENDENT_AMBULATORY_CARE_PROVIDER_SITE_OTHER): Payer: No Typology Code available for payment source | Admitting: Primary Care

## 2018-11-03 VITALS — BP 118/78 | HR 64 | Temp 98.2°F | Ht 68.0 in | Wt 187.5 lb

## 2018-11-03 DIAGNOSIS — Z91030 Bee allergy status: Secondary | ICD-10-CM | POA: Diagnosis not present

## 2018-11-03 DIAGNOSIS — J4520 Mild intermittent asthma, uncomplicated: Secondary | ICD-10-CM | POA: Diagnosis not present

## 2018-11-03 DIAGNOSIS — Z87898 Personal history of other specified conditions: Secondary | ICD-10-CM

## 2018-11-03 DIAGNOSIS — Z91038 Other insect allergy status: Secondary | ICD-10-CM

## 2018-11-03 NOTE — Assessment & Plan Note (Signed)
Uses promethazine and/or meclizine PRN. Continue same.

## 2018-11-03 NOTE — Patient Instructions (Signed)
I placed the referral to the [facility] in [place]. Let me know if you run into any problems.  Please schedule a physical with me for anytime at your convenience. You may also schedule a lab only appointment 3-4 days prior. We will discuss your lab results in detail during your physical.  It was a pleasure to meet you today! Please don't hesitate to call or message me with any questions. Welcome to [facility]!

## 2018-11-03 NOTE — Assessment & Plan Note (Signed)
Following with [facility] and All[facility] clinic in [place]. Referral placed so he may continue treatment. No recent use of Epi pens or albuterol inhaler.  No wheezing on exam.

## 2018-11-03 NOTE — Progress Notes (Signed)
Subjective:    Patient ID: Clifford Hall, male    DOB: 05/25/1986, 33 y.o.   MRN: [id number]  HPI  Clifford Hall is a 33 year old male who presents today to establish care and discuss the problems mentioned below. Will obtain old records.  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 Currently following with [facility] in [place]. He is prescribed an albuterol inhaler, doesn't use as he doesn't feel he needs. He does possess two epi pen's, no recent use. He is also undergoing immunotherapy every 6 weeks. He is needing a referral to his allergist given the Focus Plan through [facility].   2) Motion Sickness: Long history. Takes promethazine PRN when traveling, also Meclizine at times. Overall does well with this treatment.   3) Frequent Headaches: History of Migraines. Occurs every 2-3 weeks now, previously more frequent. Stress induced. Mostly manages with Ibuprofen, seeing a chiropractor, and working on stress reducers.   Review of Systems  Respiratory: Negative for shortness of breath and wheezing.   Cardiovascular: Negative for chest pain.  Allergic/Immunologic: Positive for environmental allergies.  Neurological: Negative for dizziness.       Intermittent headaches.  Psychiatric/Behavioral:       Some stress, overall manages well       Past Medical History:  Diagnosis Date  . History of asthma   . History of chickenpox   . History of headache   . Hymenoptera allergy      Social History   Socioeconomic History  . Marital status: Married    Spouse name: Not on file  . Number of children: Not on file  . Years of education: Not on file  . Highest education level: Not on file  Occupational History  . Not on file  Social Needs  . Financial resource strain: Not on file  . Food insecurity:    Worry: Not on file    Inability: Not on file  . Transportation needs:    Medical: Not on file   Non-medical: Not on file  Tobacco Use  . Smoking status: Never Smoker  . Smokeless tobacco: Never Used  Substance and Sexual Activity  . Alcohol use: Yes    Comment: Occationally - once or twice monthly  . Drug use: No  . Sexual activity: Not on file  Lifestyle  . Physical activity:    Days per week: Not on file    Minutes per session: Not on file  . Stress: Not on file  Relationships  . Social connections:    Talks on phone: Not on file    Gets together: Not on file    Attends religious service: Not on file    Active member of club or organization: Not on file    Attends meetings of clubs or organizations: Not on file    Relationship status: Not on file  . Intimate partner violence:    Fear of current or ex partner: Not on file    Emotionally abused: Not on file    Physically abused: Not on file    Forced sexual activity: Not on file  Other Topics Concern  . Not on file  Social History Narrative   Married.   3 children, 1 child on the way.   Works in Radiation protection practitioner.    Enjoys playing video games, spending time with family.    Past Surgical History:  Procedure Laterality Date  . I&D EXTREMITY  09/28/2012   Procedure: IRRIGATION AND DEBRIDEMENT EXTREMITY;  Surgeon: Dominica SeverinWilliam Gramig, MD;  [facility];  Service: Orthopedics;  Laterality: Left;  . NERVE AND TENDON REPAIR  09/28/2012   Procedure: NERVE AND TENDON REPAIR;  Surgeon: Dominica SeverinWilliam Gramig, MD;  Location: [facility] OR;  Service: Orthopedics;  Laterality: Left;  . WISDOM TOOTH EXTRACTION  2008    Family History  Problem Relation Age of Onset  . Kidney Stones Father   . Hyperlipidemia Father   . Gout Father   . Thyroid disease Sister   . Cancer Maternal Aunt   . Thyroid disease Maternal Aunt   . Heart disease Maternal Grandfather   . Stroke Maternal Grandfather   . Kidney Stones Maternal Grandfather   . Heart disease Paternal Grandfather   . Kidney Stones Paternal Grandfather   . Gout Paternal Grandfather   .  Leukemia Paternal Grandfather   . Hyperlipidemia Mother   . Gout Paternal Grandmother     Allergies  Allergen Reactions  . Bee Venom Anaphylaxis    Current Outpatient Medications on File Prior to Visit  Medication Sig Dispense Refill  . diphenhydrAMINE (BENADRYL) 25 MG tablet Take 50 mg by mouth every 6 (six) hours as needed.    Marland Kitchen. ibuprofen (ADVIL,MOTRIN) 200 MG tablet Take 400 mg by mouth every 6 (six) hours as needed. For pain     No current facility-administered medications on file prior to visit.     BP 118/78 (BP Location: Left Arm, Patient Position: Sitting, Cuff Size: Normal)   Pulse 64   Temp 98.2 F (36.8 C) (Oral)   Ht 5\' 8"  (1.727 m)   Wt 187 lb 8 oz (85 kg)   SpO2 99%   BMI 28.51 kg/m    Objective:   Physical Exam  Constitutional: He appears well-nourished.  Neck: Neck supple.  Cardiovascular: Normal rate and regular rhythm.  Respiratory: Effort normal and breath sounds normal.  Skin: Skin is warm and dry.  Psychiatric: He has a normal mood and affect.           Assessment & Plan:

## 2018-11-03 NOTE — Assessment & Plan Note (Signed)
Diagnosed several years ago, asymptomatic. No wheezing on exam. Continue to monitor.

## 2018-11-09 ENCOUNTER — Other Ambulatory Visit: Payer: Self-pay | Admitting: Primary Care

## 2018-11-09 DIAGNOSIS — Z0000 Encounter for general adult medical examination without abnormal findings: Secondary | ICD-10-CM

## 2018-11-16 ENCOUNTER — Other Ambulatory Visit (INDEPENDENT_AMBULATORY_CARE_PROVIDER_SITE_OTHER): Payer: No Typology Code available for payment source

## 2018-11-16 DIAGNOSIS — Z0000 Encounter for general adult medical examination without abnormal findings: Secondary | ICD-10-CM | POA: Diagnosis not present

## 2018-11-16 LAB — LIPID PANEL
CHOL/HDL RATIO: 5
Cholesterol: 204 mg/dL — ABNORMAL HIGH (ref 0–200)
HDL: 43.6 mg/dL (ref >39.00)
LDL CALC: 134 mg/dL — AB (ref 0–99)
NONHDL: 160.31
Triglycerides: 133 mg/dL (ref 0.0–149.0)
VLDL: 26.6 mg/dL (ref 0.0–40.0)

## 2018-11-16 LAB — COMPREHENSIVE METABOLIC PANEL
ALT: 28 U/L (ref 0–53)
AST: 19 U/L (ref 0–37)
Albumin: 4.5 g/dL (ref 3.5–5.2)
Alkaline Phosphatase: 62 U/L (ref 39–117)
BUN: 14 mg/dL (ref 6–23)
CHLORIDE: 104 meq/L (ref 96–112)
CO2: 27 meq/L (ref 19–32)
Calcium: 9.9 mg/dL (ref 8.4–10.5)
Creatinine, Ser: 0.82 mg/dL (ref 0.40–1.50)
GFR: 109.77 mL/min (ref >60.00)
GLUCOSE: 81 mg/dL (ref 70–99)
POTASSIUM: 4.1 meq/L (ref 3.5–5.1)
SODIUM: 138 meq/L (ref 135–145)
Total Bilirubin: 0.5 mg/dL (ref 0.2–1.2)
Total Protein: 7.2 g/dL (ref 6.0–8.3)

## 2018-11-22 ENCOUNTER — Ambulatory Visit (INDEPENDENT_AMBULATORY_CARE_PROVIDER_SITE_OTHER): Payer: No Typology Code available for payment source | Admitting: *Deleted

## 2018-11-22 DIAGNOSIS — T63441D Toxic effect of venom of bees, accidental (unintentional), subsequent encounter: Secondary | ICD-10-CM

## 2018-11-23 ENCOUNTER — Encounter: Payer: Self-pay | Admitting: Primary Care

## 2018-11-23 ENCOUNTER — Ambulatory Visit (INDEPENDENT_AMBULATORY_CARE_PROVIDER_SITE_OTHER): Payer: No Typology Code available for payment source | Admitting: Primary Care

## 2018-11-23 VITALS — BP 120/76 | HR 76 | Temp 98.5°F | Ht 68.0 in | Wt 187.2 lb

## 2018-11-23 DIAGNOSIS — Z87898 Personal history of other specified conditions: Secondary | ICD-10-CM

## 2018-11-23 DIAGNOSIS — Z0000 Encounter for general adult medical examination without abnormal findings: Secondary | ICD-10-CM | POA: Insufficient documentation

## 2018-11-23 DIAGNOSIS — Z91030 Bee allergy status: Secondary | ICD-10-CM | POA: Diagnosis not present

## 2018-11-23 DIAGNOSIS — Z91038 Other insect allergy status: Secondary | ICD-10-CM

## 2018-11-23 NOTE — Assessment & Plan Note (Signed)
No recent episodes  Continue to monitor

## 2018-11-23 NOTE — Assessment & Plan Note (Signed)
Following with allergist, has resumed treatment.

## 2018-11-23 NOTE — Patient Instructions (Signed)
Start exercising. You should be getting 150 minutes of moderate intensity exercise weekly.  It's important to improve your diet by reducing consumption of fast food, fried food, processed snack foods, sugary drinks. Increase consumption of fresh vegetables and fruits, whole grains, water.  Ensure you are drinking 64 ounces of water daily.  We will see you in one year for your annual exam or sooner if needed.  It was a pleasure to see you today!   Preventive Care 18-39 Years, Male Preventive care refers to lifestyle choices and visits with your health care provider that can promote health and wellness. What does preventive care include?   A yearly physical exam. This is also called an annual well check.  Dental exams once or twice a year.  Routine eye exams. Ask your health care provider how often you should have your eyes checked.  Personal lifestyle choices, including: ? Daily care of your teeth and gums. ? Regular physical activity. ? Eating a healthy diet. ? Avoiding tobacco and drug use. ? Limiting alcohol use. ? Practicing safe sex. What happens during an annual well check? The services and screenings done by your health care provider during your annual well check will depend on your age, overall health, lifestyle risk factors, and family history of disease. Counseling Your health care provider may ask you questions about your:  Alcohol use.  Tobacco use.  Drug use.  Emotional well-being.  Home and relationship well-being.  Sexual activity.  Eating habits.  Work and work Statistician. Screening You may have the following tests or measurements:  Height, weight, and BMI.  Blood pressure.  Lipid and cholesterol levels. These may be checked every 5 years starting at age 55.  Diabetes screening. This is done by checking your blood sugar (glucose) after you have not eaten for a while (fasting).  Skin check.  Hepatitis C blood test.  Hepatitis B blood  test.  Sexually transmitted disease (STD) testing. Discuss your test results, treatment options, and if necessary, the need for more tests with your health care provider. Vaccines Your health care provider may recommend certain vaccines, such as:  Influenza vaccine. This is recommended every year.  Tetanus, diphtheria, and acellular pertussis (Tdap, Td) vaccine. You may need a Td booster every 10 years.  Varicella vaccine. You may need this if you have not been vaccinated.  HPV vaccine. If you are 82 or younger, you may need three doses over 6 months.  Measles, mumps, and rubella (MMR) vaccine. You may need at least one dose of MMR.You may also need a second dose.  Pneumococcal 13-valent conjugate (PCV13) vaccine. You may need this if you have certain conditions and have not been vaccinated.  Pneumococcal polysaccharide (PPSV23) vaccine. You may need one or two doses if you smoke cigarettes or if you have certain conditions.  Meningococcal vaccine. One dose is recommended if you are age 22-21 years and a first-year college student living in a residence hall, or if you have one of several medical conditions. You may also need additional booster doses.  Hepatitis A vaccine. You may need this if you have certain conditions or if you travel or work in places where you may be exposed to hepatitis A.  Hepatitis B vaccine. You may need this if you have certain conditions or if you travel or work in places where you may be exposed to hepatitis B.  Haemophilus influenzae type b (Hib) vaccine. You may need this if you have certain risk factors. Talk to  your health care provider about which screenings and vaccines you need and how often you need them. This information is not intended to replace advice given to you by your health care provider. Make sure you discuss any questions you have with your health care provider. Document Released: 12/02/2001 Document Revised: 05/19/2017 Document Reviewed:  08/07/2015 Elsevier Interactive Patient Education  2019 [employer].

## 2018-11-23 NOTE — Assessment & Plan Note (Signed)
Immunizations UTD. Recommended to work on diet, start with regular exercise.  Exam unremarkable. Labs reviewed. Follow up in 1 year for CPE.

## 2018-11-23 NOTE — Progress Notes (Signed)
Subjective:    Patient ID: Clifford Hall, male    DOB: [birth year], 33 y.o.   MRN: [id number]  HPI  [name] is a 33 year old male who presents today for complete physical.  Immunizations: -Tetanus: Completed in 2018 -Influenza: Completed this season    Diet: He endorses a fair diet Breakfast: Fast food Lunch: Fast food Dinner: Fast food, take out food, pasta, chicken, beef, beans, little vegetables  Snacks: Crackers, chips Desserts: 5 days weekly  Beverages: Sweet tea, coffee, soda, little water  Exercise: He is not exercising Eye exam: Completed years ago Dental exam: Semi-annually    BP Readings from Last 3 Encounters:  11/23/18 120/76  11/03/18 118/78  08/02/18 100/60      Review of Systems  Constitutional: Negative for unexpected weight change.  HENT: Negative for rhinorrhea.   Respiratory: Negative for cough and shortness of breath.   Cardiovascular: Negative for chest pain.  Gastrointestinal: Negative for constipation and diarrhea.  Genitourinary: Negative for difficulty urinating.  Musculoskeletal: Negative for arthralgias and myalgias.  Skin: Negative for rash.  Allergic/Immunologic: Negative for environmental allergies.  Neurological: Negative for dizziness, numbness and headaches.  Psychiatric/Behavioral: The patient is not nervous/anxious.        Past Medical History:  Diagnosis Date  . History of asthma   . History of chickenpox   . History of headache   . Hymenoptera allergy      Social History   Socioeconomic History  . Marital status: Married    Spouse name: Not on file  . Number of children: Not on file  . Years of education: Not on file  . Highest education level: Not on file  Occupational History  . Not on file  Social Needs  . Financial resource strain: Not on file  . Food insecurity:    Worry: Not on file    Inability: Not on file  . Transportation needs:    Medical: Not on file    Non-medical: Not on file    Tobacco Use  . Smoking status: Never Smoker  . Smokeless tobacco: Never Used  Substance and Sexual Activity  . Alcohol use: Yes    Comment: Occationally - once or twice monthly  . Drug use: No  . Sexual activity: Not on file  Lifestyle  . Physical activity:    Days per week: Not on file    Minutes per session: Not on file  . Stress: Not on file  Relationships  . Social connections:    Talks on phone: Not on file    Gets together: Not on file    Attends religious service: Not on file    Active member of club or organization: Not on file    Attends meetings of clubs or organizations: Not on file    Relationship status: Not on file  . Intimate partner violence:    Fear of current or ex partner: Not on file    Emotionally abused: Not on file    Physically abused: Not on file    Forced sexual activity: Not on file  Other Topics Concern  . Not on file  Social History Narrative   Married.   3 children, 1 child on the way.   Works in Radiation protection practitionerprinting industry.    Enjoys playing video games, spending time with family.    Past Surgical History:  Procedure Laterality Date  . I&D EXTREMITY  09/28/2012   Procedure: IRRIGATION AND DEBRIDEMENT EXTREMITY;  Surgeon: Dominica SeverinWilliam Gramig,  MD;  Location: [facility];  Service: Orthopedics;  Laterality: Left;  . NERVE AND TENDON REPAIR  09/28/2012   Procedure: NERVE AND TENDON REPAIR;  Surgeon: Dominica SeverinWilliam Gramig, MD;  Location: [facility];  Service: Orthopedics;  Laterality: Left;  . WISDOM TOOTH EXTRACTION  2008    Family History  Problem Relation Age of Onset  . Kidney Stones Father   . Hyperlipidemia Father   . Gout Father   . Thyroid disease Sister   . Cancer Maternal Aunt   . Thyroid disease Maternal Aunt   . Heart disease Maternal Grandfather   . Stroke Maternal Grandfather   . Kidney Stones Maternal Grandfather   . Heart disease Paternal Grandfather   . Kidney Stones Paternal Grandfather   . Gout Paternal Grandfather   . Leukemia Paternal  Grandfather   . Hyperlipidemia Mother   . Gout Paternal Grandmother     Allergies  Allergen Reactions  . Bee Venom Anaphylaxis    Current Outpatient Medications on File Prior to Visit  Medication Sig Dispense Refill  . diphenhydrAMINE (BENADRYL) 25 MG tablet Take 50 mg by mouth every 6 (six) hours as needed.    Marland Kitchen. ibuprofen (ADVIL,MOTRIN) 200 MG tablet Take 400 mg by mouth every 6 (six) hours as needed. For pain    . promethazine (PHENERGAN) 12.5 MG tablet Take 12.5 mg by mouth every 6 (six) hours as needed.     No current facility-administered medications on file prior to visit.     BP 120/76   Pulse 76   Temp 98.5 F (36.9 C) (Oral)   Ht 5\' 8"  (1.727 m)   Wt 187 lb 4 oz (84.9 kg)   SpO2 98%   BMI 28.47 kg/m    Objective:   Physical Exam  Constitutional: He is oriented to person, place, and time. He appears well-nourished.  HENT:  Mouth/Throat: No oropharyngeal exudate.  Eyes: Pupils are equal, round, and reactive to light. EOM are normal.  Neck: Neck supple. No thyromegaly present.  Cardiovascular: Normal rate and regular rhythm.  Respiratory: Effort normal and breath sounds normal.  GI: Soft. Bowel sounds are normal. There is no abdominal tenderness.  Musculoskeletal: Normal range of motion.  Neurological: He is alert and oriented to person, place, and time.  Skin: Skin is warm and dry.  Psychiatric: He has a normal mood and affect.           Assessment & Plan:

## 2018-11-24 NOTE — Progress Notes (Signed)
Patient came in to get venom shot and could not get 1/2 and wait 30 min for the second 1/2. Patient was late on injections so we would give him half only today and he return next Monday 11/29/2018.

## 2018-11-29 ENCOUNTER — Ambulatory Visit (INDEPENDENT_AMBULATORY_CARE_PROVIDER_SITE_OTHER): Payer: No Typology Code available for payment source | Admitting: *Deleted

## 2018-11-29 DIAGNOSIS — T63441D Toxic effect of venom of bees, accidental (unintentional), subsequent encounter: Secondary | ICD-10-CM

## 2019-01-10 ENCOUNTER — Ambulatory Visit: Payer: 59

## 2019-03-17 ENCOUNTER — Telehealth: Payer: No Typology Code available for payment source | Admitting: Physician Assistant

## 2019-03-17 DIAGNOSIS — H01001 Unspecified blepharitis right upper eyelid: Secondary | ICD-10-CM | POA: Diagnosis not present

## 2019-03-17 DIAGNOSIS — H00011 Hordeolum externum right upper eyelid: Secondary | ICD-10-CM

## 2019-03-17 MED ORDER — NEOMYCIN-POLYMYXIN-HC 3.5-10000-1 OP SUSP: OPHTHALMIC | 0 refills | Status: DC

## 2019-03-17 NOTE — Progress Notes (Signed)
I have spent 5 minutes in review of e-visit questionnaire, review and updating patient chart, medical decision making and response to patient.   Ari Engelbrecht Cody Chi Garlow, PA-C    

## 2019-03-17 NOTE — Progress Notes (Signed)
We are sorry that you are not feeling well. Here is how we plan to help!  Based on what you have shared with me it looks like you have a stye.  A stye is an inflammation of the eyelid.  It is often a red, painful lump near the edge of the eyelid that may look like a boil or a pimple.  A stye develops when an infection occurs at the base of an eyelash.   We have made appropriate suggestions for you based upon your presentation: Your symptoms may indicate an infection of the eyelid along with the stye. Continue was compresses.  The use of anti-inflammatory and antibiotic eye drops for a week will help resolve this condition.  I have sent in neomycin-polymyxin HC opthalmic suspension, two to three drops in the affected eye every 4-6 hours.  If your symptoms do not improve over the next two to three days you should be seen in your doctor's office.  HOME CARE:   Wash your hands often!  Let the stye open on its own. Don't squeeze or open it.  Don't rub your eyes. This can irritate your eyes and let in bacteria.  If you need to touch your eyes, wash your hands first.  Don't wear eye makeup or contact lenses until the area has healed.  GET HELP RIGHT AWAY IF:   Your symptoms do not improve.  You develop blurred or loss of vision.  Your symptoms worsen (increased discharge, pain or redness).  Thank you for choosing an e-visit.  Your e-visit answers were reviewed by a board certified advanced clinical practitioner to complete your personal care plan.  Depending upon the condition, your plan could have included both over the counter or prescription medications.  Please review your pharmacy choice.  Make sure the pharmacy is open so you can pick up prescription now.  If there is a problem, you may contact your provider through [employer] and have the prescription routed to another pharmacy.    Your safety is important to [place].  If you have drug allergies check your prescription  carefully.  For the next 24 hours you can use MyChart to ask questions about today's visit, request a non-urgent call back, or ask for a work or school excuse.  You will get an email in the next two days asking about your experience.  I hope you that your e-visit has been valuable and will speed your recovery.

## 2019-03-18 MED FILL — NEOMYCIN/POLY/HC EYE DROPS: 3.5-10000-1 | 7 days supply | Qty: 8 | Fill #0

## 2019-07-04 ENCOUNTER — Ambulatory Visit (INDEPENDENT_AMBULATORY_CARE_PROVIDER_SITE_OTHER): Payer: No Typology Code available for payment source | Admitting: Primary Care

## 2019-07-04 ENCOUNTER — Other Ambulatory Visit: Payer: Self-pay

## 2019-07-04 VITALS — BP 114/66 | HR 73 | Temp 98.6°F | Ht 68.0 in | Wt 182.8 lb

## 2019-07-04 DIAGNOSIS — F5102 Adjustment insomnia: Secondary | ICD-10-CM | POA: Diagnosis not present

## 2019-07-04 DIAGNOSIS — Z91030 Bee allergy status: Secondary | ICD-10-CM | POA: Diagnosis not present

## 2019-07-04 DIAGNOSIS — R5383 Other fatigue: Secondary | ICD-10-CM

## 2019-07-04 DIAGNOSIS — Z91038 Other insect allergy status: Secondary | ICD-10-CM

## 2019-07-04 DIAGNOSIS — Z23 Encounter for immunization: Secondary | ICD-10-CM

## 2019-07-04 MED ORDER — EPINEPHRINE 0.3 MG/0.3ML IJ SOAJ: 0.3000 mg | INTRAMUSCULAR | 0 refills | Status: DC | PRN

## 2019-07-04 NOTE — Assessment & Plan Note (Signed)
Likely secondary to lack of sleep and stress. Check labs today to rule out metabolic cause.

## 2019-07-04 NOTE — Progress Notes (Signed)
Subjective:    Patient ID: Clifford Hall, male    DOB: 1986-07-31, 33 y.o.   MRN: [id number]  HPI  Clifford Hall is a 33 year old male who presents today with a chief complaint of fatigue and insomnia.   Symptoms present for months. He feels as though his fatigue comes from lack of sleep due to numerous reasons. He has four children and is often up in the middle of the night with his 8118 month old. When laying down to rest at night he experiences mind racing thoughts from the day. He is very busy with his four children, going back to school, and running his own business.   He is trying to get himself into a morning and evening routine so that he can spend time alone but has struggled. He tries to wake up at 4 am to exercise but recently has struggled due to fatigue from lack of sleep and energy. He started Shakeology and Pre and [employer] work out drinks which contain vitamins and electrolytes. He has noticed that once he does the shake and work out drinks for several consecutive days his symptoms will improve. He's also been doing yoga which has helped with overall fatigue and anxiety.   Today he's mostly wanting help with sleeping. He feels as though if he had a good nights sleep his symptoms would improve. He has not tried anything OTC.  Review of Systems  Respiratory: Negative for shortness of breath.   Cardiovascular: Negative for chest pain.  Psychiatric/Behavioral: Positive for sleep disturbance. The patient is nervous/anxious.        See HPI       Past Medical History:  Diagnosis Date  . History of asthma   . History of chickenpox   . History of headache   . Hymenoptera allergy      Social History   Socioeconomic History  . Marital status: Married    Spouse name: Not on file  . Number of children: Not on file  . Years of education: Not on file  . Highest education level: Not on file  Occupational History  . Not on file  Social Needs  . Financial  resource strain: Not on file  . Food insecurity    Worry: Not on file    Inability: Not on file  . Transportation needs    Medical: Not on file    Non-medical: Not on file  Tobacco Use  . Smoking status: Never Smoker  . Smokeless tobacco: Never Used  Substance and Sexual Activity  . Alcohol use: Yes    Comment: Occationally - once or twice monthly  . Drug use: No  . Sexual activity: Not on file  Lifestyle  . Physical activity    Days per week: Not on file    Minutes per session: Not on file  . Stress: Not on file  Relationships  . Social Musicianconnections    Talks on phone: Not on file    Gets together: Not on file    Attends religious service: Not on file    Active member of club or organization: Not on file    Attends meetings of clubs or organizations: Not on file    Relationship status: Not on file  . Intimate partner violence    Fear of current or ex partner: Not on file    Emotionally abused: Not on file    Physically abused: Not on file    Forced sexual activity:  Not on file  Other Topics Concern  . Not on file  Social History Narrative   Married.   3 children, 1 child on the way.   Works in Tax adviser.    Enjoys playing video games, spending time with family.    Past Surgical History:  Procedure Laterality Date  . I&D EXTREMITY  09/28/2012   Procedure: IRRIGATION AND DEBRIDEMENT EXTREMITY;  Surgeon: Roseanne Kaufman, MD;  Location: [facility];  Service: Orthopedics;  Laterality: Left;  . NERVE AND TENDON REPAIR  09/28/2012   Procedure: NERVE AND TENDON REPAIR;  Surgeon: Roseanne Kaufman, MD;  Location: [facility];  Service: Orthopedics;  Laterality: Left;  . WISDOM TOOTH EXTRACTION  2008    Family History  Problem Relation Age of Onset  . Kidney Stones Father   . Hyperlipidemia Father   . Gout Father   . Thyroid disease Sister   . Cancer Maternal Aunt   . Thyroid disease Maternal Aunt   . Heart disease Maternal Grandfather   . Stroke Maternal Grandfather   .  Kidney Stones Maternal Grandfather   . Heart disease Paternal Grandfather   . Kidney Stones Paternal Grandfather   . Gout Paternal Grandfather   . Leukemia Paternal Grandfather   . Hyperlipidemia Mother   . Gout Paternal Grandmother     Allergies  Allergen Reactions  . Bee Venom Anaphylaxis    Current Outpatient Medications on File Prior to Visit  Medication Sig Dispense Refill  . ibuprofen (ADVIL,MOTRIN) 200 MG tablet Take 400 mg by mouth every 6 (six) hours as needed. For pain    . diphenhydrAMINE (BENADRYL) 25 MG tablet Take 50 mg by mouth every 6 (six) hours as needed.    . promethazine (PHENERGAN) 12.5 MG tablet Take 12.5 mg by mouth every 6 (six) hours as needed.     No current facility-administered medications on file prior to visit.     BP 114/66   Pulse 73   Temp 98.6 F (37 C) (Temporal)   Ht 5\' 8"  (1.727 m)   Wt 182 lb 12 oz (82.9 kg)   SpO2 98%   BMI 27.79 kg/m    Objective:   Physical Exam  Constitutional: He appears well-nourished.  Neck: Neck supple.  Cardiovascular: Normal rate and regular rhythm.  Respiratory: Effort normal and breath sounds normal.  Skin: Skin is warm and dry.  Psychiatric: He has a normal mood and affect.           Assessment & Plan:

## 2019-07-04 NOTE — Assessment & Plan Note (Signed)
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 Strongly advise he carve out time to spending with his wife and also for himself.  We will start with a healthy bedtime routine, also Melatonin as needed. If no improvement then consider other OTC product vs low dose Trazodone.  He will update.

## 2019-07-04 NOTE — Patient Instructions (Signed)
Stop by the lab prior to leaving today. I will notify you of your results once received.   You can try Melatonin tablets one hour prior to bedtime for sleep. Start by taking 5 mg, may increase to 10 mg if needed.  Try to work on establishing a routine with one goal at a time. Avoid TV, computers, tablets, phones/emails within one hour prior to bedtime.   Ensure you are consuming 64 ounces of water daily.  It's important to improve your diet by reducing consumption of fast food, fried food, processed snack foods, sugary drinks. Increase consumption of fresh vegetables and fruits, whole grains, water.  Please update me in 2 weeks as discussed.  It was a pleasure to see you today!

## 2019-07-04 NOTE — Assessment & Plan Note (Signed)
Current epi pen is about to expire, new Rx sent to pharmacy.

## 2019-07-05 LAB — BASIC METABOLIC PANEL
BUN: 15 mg/dL (ref 6–23)
CO2: 27 mEq/L (ref 19–32)
Calcium: 10.2 mg/dL (ref 8.4–10.5)
Chloride: 104 mEq/L (ref 96–112)
Creatinine, Ser: 0.82 mg/dL (ref 0.40–1.50)
GFR: 109.31 mL/min (ref >60.00)
Glucose, Bld: 102 mg/dL — ABNORMAL HIGH (ref 70–99)
Potassium: 4 mEq/L (ref 3.5–5.1)
Sodium: 139 mEq/L (ref 135–145)

## 2019-07-05 LAB — CBC
HCT: 41.2 % (ref 39.0–52.0)
Hemoglobin: 13.6 g/dL (ref 13.0–17.0)
MCHC: 32.9 g/dL (ref 30.0–36.0)
MCV: 85.6 fl (ref 78.0–100.0)
Platelets: 367 10*3/uL (ref 150.0–400.0)
RBC: 4.81 Mil/uL (ref 4.22–5.81)
RDW: 14 % (ref 11.5–15.5)
WBC: 7 10*3/uL (ref 4.0–10.5)

## 2019-07-05 LAB — TSH: TSH: 1.13 u[IU]/mL (ref 0.35–4.50)

## 2019-07-05 LAB — VITAMIN B12: Vitamin B-12: 303 pg/mL (ref 211–911)

## 2019-10-18 ENCOUNTER — Ambulatory Visit: Payer: No Typology Code available for payment source | Attending: Internal Medicine

## 2019-10-18 DIAGNOSIS — Z20822 Contact with and (suspected) exposure to covid-19: Secondary | ICD-10-CM

## 2019-10-19 LAB — NOVEL CORONAVIRUS, NAA: SARS-CoV-2, NAA: NOT DETECTED

## 2019-10-24 ENCOUNTER — Telehealth (INDEPENDENT_AMBULATORY_CARE_PROVIDER_SITE_OTHER): Payer: No Typology Code available for payment source | Admitting: Primary Care

## 2019-10-24 DIAGNOSIS — J069 Acute upper respiratory infection, unspecified: Secondary | ICD-10-CM | POA: Diagnosis not present

## 2019-10-24 NOTE — Patient Instructions (Signed)
Please update me if you start to run fevers, your other symptoms do not continue to improve, you feel worse.  It was a pleasure to see you today! Mayra Reel, NP-C

## 2019-10-24 NOTE — Progress Notes (Signed)
Subjective:    Patient ID: Clifford Hall, male    DOB: 24-May-1986, 34 y.o.   MRN: [id number]  HPI  Virtual Visit via Video Note  I connected with Clifford Hall on 10/24/19 at  9:20 AM EST by a video enabled telemedicine application and verified that I am speaking with the correct person using two identifiers.  Location: Patient: Home Provider: Office   I discussed the limitations of evaluation and management by telemedicine and the availability of in person appointments. The patient expressed understanding and agreed to proceed.  History of Present Illness:  Clifford Hall is a 34 year old male with a history of mild/intermittent asthma who presents today with a chief complaint of congestion.  He was tested for Covid-19 on 10/18/19, result negative on 10/20/19. His children started running low grade fevers, rhinorrhea shortly after Christmas day. On December 27th-28th he experienced joint aches, cough with expulsion of green sputum, nasal congestion, chest congestion, chills. He did not run fevers. He had his flu shot in October 2020.  He's been isolating himself at home from work over the last week due to symptoms, is feeling better now but continues to experience chest and nasal congestion. He's taking Dayquil and Nyquil intermittently with improvement. His children have nearly all recovered well.    Observations/Objective:  Alert and oriented. Appears well, not sickly. No distress. Speaking in complete sentences.   Assessment and Plan:  Viral URI with cough:  Suspect viral etiology, negative Covid-19 test last week. He appears well overall, no cough. Discussed that as long as he's feeling better and is not running fevers he may return to work. Discussed return precautions, he will update if anything changes.  Follow Up Instructions:  Please update me if you start to run fevers, your other symptoms do not continue to improve, you feel worse.   It was a pleasure to see you today! [name], NP-C    I discussed the assessment and treatment plan with the patient. The patient was provided an opportunity to ask questions and all were answered. The patient agreed with the plan and demonstrated an understanding of the instructions.   The patient was advised to call back or seek an in-person evaluation if the symptoms worsen or if the condition fails to improve as anticipated.    Pleas [name], NP    Review of Systems  Constitutional: Negative for chills, fatigue and fever.  HENT: Positive for congestion. Negative for sore throat.   Respiratory: Positive for cough. Negative for shortness of breath.   Cardiovascular: Negative for chest pain.  Allergic/Immunologic: Positive for environmental allergies.       Past Medical History:  Diagnosis Date  . History of asthma   . History of chickenpox   . History of headache   . Hymenoptera allergy      Social History   Socioeconomic History  . Marital status: Married    Spouse name: Not on file  . Number of children: Not on file  . Years of education: Not on file  . Highest education level: Not on file  Occupational History  . Not on file  Tobacco Use  . Smoking status: Never Smoker  . Smokeless tobacco: Never Used  Substance and Sexual Activity  . Alcohol use: Yes    Comment: Occationally - once or twice monthly  . Drug use: No  . Sexual activity: Not on file  Other Topics Concern  . Not on file  Social History Narrative   Married.   3 children, 1 child on the way.   Works in Radiation protection practitioner.    Enjoys playing video games, spending time with family.   Social Determinants of Health   Financial Resource Strain:   . Difficulty of Paying Living Expenses: Not on file  Food Insecurity:   . Worried About Programme researcher, broadcasting/film/video in the Last Year: Not on file  . Ran Out of Food in the Last Year: Not on file  Transportation Needs:   . Lack of Transportation  (Medical): Not on file  . Lack of Transportation (Non-Medical): Not on file  Physical Activity:   . Days of Exercise per Week: Not on file  . Minutes of Exercise per Session: Not on file  Stress:   . Feeling of Stress : Not on file  Social Connections:   . Frequency of Communication with Friends and Family: Not on file  . Frequency of Social Gatherings with Friends and Family: Not on file  . Attends Religious Services: Not on file  . Active Member of Clubs or Organizations: Not on file  . Attends Banker Meetings: Not on file  . Marital Status: Not on file  Intimate Partner Violence:   . Fear of Current or Ex-Partner: Not on file  . Emotionally Abused: Not on file  . Physically Abused: Not on file  . Sexually Abused: Not on file    Past Surgical History:  Procedure Laterality Date  . I & D EXTREMITY  09/28/2012   Procedure: IRRIGATION AND DEBRIDEMENT EXTREMITY;  Surgeon: Dominica Severin, MD;  Location: [facility];  Service: Orthopedics;  Laterality: Left;  . NERVE AND TENDON REPAIR  09/28/2012   Procedure: NERVE AND TENDON REPAIR;  Surgeon: Dominica Severin, MD;  Location: [facility];  Service: Orthopedics;  Laterality: Left;  . WISDOM TOOTH EXTRACTION  2008    Family History  Problem Relation Age of Onset  . Kidney Stones Father   . Hyperlipidemia Father   . Gout Father   . Thyroid disease Sister   . Cancer Maternal Aunt   . Thyroid disease Maternal Aunt   . Heart disease Maternal Grandfather   . Stroke Maternal Grandfather   . Kidney Stones Maternal Grandfather   . Heart disease Paternal Grandfather   . Kidney Stones Paternal Grandfather   . Gout Paternal Grandfather   . Leukemia Paternal Grandfather   . Hyperlipidemia Mother   . Gout Paternal Grandmother     Allergies  Allergen Reactions  . Bee Venom Anaphylaxis    Current Outpatient Medications on File Prior to Visit  Medication Sig Dispense Refill  . diphenhydrAMINE (BENADRYL) 25 MG tablet Take 50 mg by  mouth every 6 (six) hours as needed.    Marland Kitchen EPINEPHrine (AUVI-Q) 0.3 mg/0.3 mL IJ SOAJ injection Inject 0.3 mLs (0.3 mg total) into the muscle as needed for anaphylaxis. 1 each 0  . ibuprofen (ADVIL,MOTRIN) 200 MG tablet Take 400 mg by mouth every 6 (six) hours as needed. For pain    . promethazine (PHENERGAN) 12.5 MG tablet Take 12.5 mg by mouth every 6 (six) hours as needed.     No current facility-administered medications on file prior to visit.    There were no vitals taken for this visit.   Objective:   Physical Exam  Constitutional: He is oriented to person, place, and time. He appears well-nourished.  Respiratory: Effort normal.  No cough during exam  Neurological: He is alert and  oriented to person, place, and time.  Psychiatric: He has a normal mood and affect.           Assessment & Plan:

## 2019-10-28 ENCOUNTER — Ambulatory Visit (INDEPENDENT_AMBULATORY_CARE_PROVIDER_SITE_OTHER): Payer: No Typology Code available for payment source

## 2019-10-28 ENCOUNTER — Other Ambulatory Visit: Payer: Self-pay

## 2019-10-28 ENCOUNTER — Encounter: Payer: Self-pay | Admitting: Emergency Medicine

## 2019-10-28 ENCOUNTER — Ambulatory Visit
Admission: EM | Admit: 2019-10-28 | Discharge: 2019-10-28 | Disposition: A | Discharge status: Home / Self Care | Payer: No Typology Code available for payment source | Attending: Family Medicine | Admitting: Family Medicine

## 2019-10-28 DIAGNOSIS — M79674 Pain in right toe(s): Secondary | ICD-10-CM

## 2019-10-28 DIAGNOSIS — S90111A Contusion of right great toe without damage to nail, initial encounter: Secondary | ICD-10-CM

## 2019-10-28 DIAGNOSIS — W208XXA Other cause of strike by thrown, projected or falling object, initial encounter: Secondary | ICD-10-CM | POA: Diagnosis not present

## 2019-10-28 MED ORDER — SULFAMETHOXAZOLE-TRIMETHOPRIM 800-160 MG PO TABS: 1.0000 | ORAL_TABLET | Freq: Two times a day (BID) | ORAL | 0 refills | Status: AC

## 2019-10-28 NOTE — Discharge Instructions (Addendum)
Take medication as prescribed. Rest. Drink plenty of fluids. Ice. Elevate. Monitor.   Follow up with your primary care physician this week as needed. Return to Urgent care for new or worsening concerns.

## 2019-10-28 NOTE — ED Triage Notes (Signed)
Pt c/o right toe pain. He states that he dropped a piece of plywood on his toe about 5 days ago. He states that it he did not have pain for 2 days but then yesterday it started hurting bad and he could barely walk on it. He also has swelling and redness in the area.

## 2019-10-28 NOTE — ED Provider Notes (Signed)
[facility] ____________________________________________  Time seen: Approximately 9:10 AM  I have reviewed the triage vital signs and the nursing notes.   HISTORY  Chief Complaint Toe Pain (APPT right great toe)   HPI Clifford Hall is a 34 y.o. male presenting for evaluation of right great toe pain.  Patient reports on Monday he and his wife were reading on the floors and excellently dropped a piece of wood directly on his right great toe.  Reports initially it hurt, but then got better.  Reports the next day he felt well, but then reports the day after his and when the pain started back.  Reports he has since noticed some redness and swelling.  States pain is predominantly with weightbearing.  Denies pain radiation, paresthesias or other injuries.  Reports otherwise doing well.  Denies aggravating or alleviating factors otherwise.  Denies recent sickness.  Doreene Nest, NP : PCP    Past Medical History:  Diagnosis Date  . History of asthma   . History of chickenpox   . History of headache   . Hymenoptera allergy     Patient Active Problem List   Diagnosis Date Noted  . 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 Fatigue 07/04/2019  . Preventative health care 11/23/2018  . Mild intermittent asthma without complication 11/03/2018  . History of motion sickness 11/03/2018  . Dyspnea/wheezing 04/15/2016  . 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 I & D EXTREMITY  09/28/2012   Procedure: IRRIGATION AND DEBRIDEMENT EXTREMITY;  Surgeon: Dominica Severin, MD;  Location: [facility];  Service: Orthopedics;  Laterality: Left;  . NERVE AND TENDON REPAIR  09/28/2012   Procedure: NERVE AND TENDON REPAIR;  Surgeon: Dominica Severin, MD;  Location: [facility];  Service: Orthopedics;  Laterality: Left;  . WISDOM TOOTH EXTRACTION  2008     No current facility-administered medications for this encounter.  Current  Outpatient Medications:  .  diphenhydrAMINE (BENADRYL) 25 MG tablet, Take 50 mg by mouth every 6 (six) hours as needed., Disp: , Rfl:  .  ibuprofen (ADVIL,MOTRIN) 200 MG tablet, Take 400 mg by mouth every 6 (six) hours as needed. For pain, Disp: , Rfl:  .  EPINEPHrine (AUVI-Q) 0.3 mg/0.3 mL IJ SOAJ injection, Inject 0.3 mLs (0.3 mg total) into the muscle as needed for anaphylaxis., Disp: 1 each, Rfl: 0 .  promethazine (PHENERGAN) 12.5 MG tablet, Take 12.5 mg by mouth every 6 (six) hours as needed., Disp: , Rfl:  .  sulfamethoxazole-trimethoprim (BACTRIM DS) 800-160 MG tablet, Take 1 tablet by mouth 2 (two) times daily for 7 days., Disp: 14 tablet, Rfl: 0  Allergies Bee venom  Family History  Problem Relation Age of Onset  . Kidney Stones Father   . Hyperlipidemia Father   . Gout Father   . Thyroid disease Sister   . Cancer Maternal Aunt   . Thyroid disease Maternal Aunt   . Heart disease Maternal Grandfather   . Stroke Maternal Grandfather   . Kidney Stones Maternal Grandfather   . Heart disease Paternal Grandfather   . Kidney Stones Paternal Grandfather   . Gout Paternal Grandfather   . Leukemia Paternal Grandfather   . Hyperlipidemia Mother   . Gout Paternal Grandmother     Social History Social History   Tobacco Use  . Smoking status: Never Smoker  . Smokeless tobacco: Never Used  Substance Use Topics  . Alcohol use: Yes  Comment: Occationally - once or twice monthly  . Drug use: No    Review of Systems Constitutional: No fever Cardiovascular: Denies chest pain. Respiratory: Denies shortness of breath. Musculoskeletal: Positive right great toe pain. Skin: Positive right great toe redness. Neurological: Negative for numbness.    ____________________________________________   PHYSICAL EXAM:  VITAL SIGNS: ED Triage Vitals  Enc Vitals Group     BP 10/28/19 0848 121/82     Pulse Rate 10/28/19 0848 78     Resp 10/28/19 0848 18     Temp 10/28/19 0848 98.3  F (36.8 C)     Temp Source 10/28/19 0848 Oral     SpO2 10/28/19 0848 99 %     Weight 10/28/19 0845 180 lb (81.6 kg)     Height 10/28/19 0845 5\' 8"  (1.727 m)     Head Circumference --      Peak Flow --      Pain Score 10/28/19 0845 4     Pain Loc --      Pain Edu? --      Excl. in GC? --     Constitutional: Alert and oriented. Well appearing and in no acute distress. Eyes: Conjunctivae are normal. ENT      Head: Normocephalic and atraumatic. Cardiovascular:  Good peripheral circulation. Respiratory: Normal respiratory effort without tachypnea nor retractions.  Musculoskeletal: Steady gait.  Right foot dorsalis pedis and posterior tibialis pulses equal and easily palpated.  Right foot distal sensation intact and capillary refill less than 2 seconds.  Right great toe proximal phalanx moderate tenderness direct palpation with mild erythema and edema, contusion along proximal nailbed, no subungual hematoma, no break in skin noted..  Right foot otherwise nontender. Neurologic:  Normal speech and language.  Skin:  Skin is warm, dry Psychiatric: Mood and affect are normal. Speech and behavior are normal. Patient exhibits appropriate insight and judgment   ___________________________________________   LABS (all labs ordered are listed, but only abnormal results are displayed)  Labs Reviewed - No data to display ____________________________________________  RADIOLOGY  DG Toe Great Right  Result Date: 10/28/2019 CLINICAL DATA:  Pain after injury. EXAM: RIGHT GREAT TOE COMPARISON:  No prior. FINDINGS: No radiopaque foreign body. No acute bony or joint abnormality. No evidence of fracture dislocation. IMPRESSION: No acute bony or joint abnormality. Electronically Signed   By: 12/26/2019  Register   On: 10/28/2019 09:20   ____________________________________________   PROCEDURES Procedures   INITIAL IMPRESSION / ASSESSMENT AND PLAN / ED COURSE  Pertinent labs & imaging results that were  available during my care of the patient were reviewed by me and considered in my medical decision making (see chart for details).  Well-appearing patient.  No acute distress.  Right great toe pain post injury.  Right great toe x-ray as above per radiologist, negative.  Suspect contusion injury with concern of secondary cellulitis.  Will empirically treat with oral Bactrim.  Encourage rest, elevation, ice and monitoring. Discussed indication, risks and benefits of medications with patient.  Discussed follow up and return parameters including no resolution or any worsening concerns. Patient verbalized understanding and agreed to plan.   ____________________________________________   FINAL CLINICAL IMPRESSION(S) / ED DIAGNOSES  Final diagnoses:  Contusion of right great toe without damage to nail, initial encounter  Great toe pain, right     ED Discharge Orders         Ordered    sulfamethoxazole-trimethoprim (BACTRIM DS) 800-160 MG tablet  2 times daily  10/28/19 0935           Note: This dictation was prepared with Dragon dictation along with smaller phrase technology. Any transcriptional errors that result from this process are unintentional.         Marylene Land, NP 10/28/19 1105

## 2019-11-09 ENCOUNTER — Telehealth (INDEPENDENT_AMBULATORY_CARE_PROVIDER_SITE_OTHER): Payer: No Typology Code available for payment source | Admitting: Primary Care

## 2019-11-09 DIAGNOSIS — M79674 Pain in right toe(s): Secondary | ICD-10-CM | POA: Diagnosis not present

## 2019-11-09 NOTE — Progress Notes (Signed)
Subjective:    Patient ID: Clifford Hall, male    DOB: [birth year], 34 y.o.   MRN: [id number]  HPI  Virtual Visit via Video Note  I connected with Clifford Hall on 11/09/19 at  8:20 AM EST by a video enabled telemedicine application and verified that I am speaking with the correct person using two identifiers.  Location: Patient: [name] Provider: Office   I discussed the limitations of evaluation and management by telemedicine and the availability of in person appointments. The patient expressed understanding and agreed to proceed.  History of Present Illness:  [name] is a 34 year old male who presents today with a chief complaint of toe pain.  He presented to [facility] [facility] on 10/28/19 with a chief complaint of right great toe pain. He dropped a piece of plywood across his toe five days prior and about 36 hours later he noticed swelling, increased pain. He underwent plain films and it was determined that he did not have fracture. He was treated with Bactrim DS tablets for suspicion of cellulitis. He took the antibiotics twice daily for 5 and 1/2 days and felt significant improvement. Several days ago he started noticing discomfor with walking and swelling with some warmth so he resumed the remaining antibiotics. Today he's noticed improvement but he's nervous that his symptoms may return.   Observations/Objective:  Very mild swelling with evidence of healing contusion to right medial great toe. No erythema, drainage. He appears well.   Assessment and Plan:  Contusion of right great toe s/p trauma. Negative xrays per UC. He does not appear to have an active cellulitis. Discussed to monitor toe, take oral NSAID's, other conservative treatment. He will update if anything changes.   Follow Up Instructions:  Take oral NSAID's to reduce inflammation and swelling.  Please update me if anything changes.  It was a pleasure to see you today! [name],  NP-C    I discussed the assessment and treatment plan with the patient. The patient was provided an opportunity to ask questions and all were answered. The patient agreed with the plan and demonstrated an understanding of the instructions.   The patient was advised to call back or seek an in-person evaluation if the symptoms worsen or if the condition fails to improve as anticipated.    Pleas [name], NP    Review of Systems  Constitutional: Negative for fever.  Musculoskeletal:       Right great toe pain and swelling  Skin: Positive for color change.       Past Medical History:  Diagnosis Date  . History of asthma   . History of chickenpox   . History of headache   . Hymenoptera allergy      Social History   Socioeconomic History  . Marital status: Married    Spouse name: Not on file  . Number of children: Not on file  . Years of education: Not on file  . Highest education level: Not on file  Occupational History  . Not on file  Tobacco Use  . Smoking status: Never Smoker  . Smokeless tobacco: Never Used  Substance and Sexual Activity  . Alcohol use: Yes    Comment: Occationally - once or twice monthly  . Drug use: No  . Sexual activity: Not on file  Other Topics Concern  . Not on file  Social History Narrative   Married.   3 children, 1 child on the way.  Works in Radiation protection practitioner.    Enjoys playing video games, spending time with family.   Social Determinants of Health   Financial Resource Strain:   . Difficulty of Paying Living Expenses: Not on file  Food Insecurity:   . Worried About Programme researcher, broadcasting/film/video in the Last Year: Not on file  . Ran Out of Food in the Last Year: Not on file  Transportation Needs:   . Lack of Transportation (Medical): Not on file  . Lack of Transportation (Non-Medical): Not on file  Physical Activity:   . Days of Exercise per Week: Not on file  . Minutes of Exercise per Session: Not on file  Stress:   . Feeling of  Stress : Not on file  Social Connections:   . Frequency of Communication with Friends and Family: Not on file  . Frequency of Social Gatherings with Friends and Family: Not on file  . Attends Religious Services: Not on file  . Active Member of Clubs or Organizations: Not on file  . Attends Banker Meetings: Not on file  . Marital Status: Not on file  Intimate Partner Violence:   . Fear of Current or Ex-Partner: Not on file  . Emotionally Abused: Not on file  . Physically Abused: Not on file  . Sexually Abused: Not on file    Past Surgical History:  Procedure Laterality Date  . I & D EXTREMITY  09/28/2012   Procedure: IRRIGATION AND DEBRIDEMENT EXTREMITY;  Surgeon: Dominica Severin, MD;  Location: [facility];  Service: Orthopedics;  Laterality: Left;  . NERVE AND TENDON REPAIR  09/28/2012   Procedure: NERVE AND TENDON REPAIR;  Surgeon: Dominica Severin, MD;  Location: [facility];  Service: Orthopedics;  Laterality: Left;  . WISDOM TOOTH EXTRACTION  2008    Family History  Problem Relation Age of Onset  . Kidney Stones Father   . Hyperlipidemia Father   . Gout Father   . Thyroid disease Sister   . Cancer Maternal Aunt   . Thyroid disease Maternal Aunt   . Heart disease Maternal Grandfather   . Stroke Maternal Grandfather   . Kidney Stones Maternal Grandfather   . Heart disease Paternal Grandfather   . Kidney Stones Paternal Grandfather   . Gout Paternal Grandfather   . Leukemia Paternal Grandfather   . Hyperlipidemia Mother   . Gout Paternal Grandmother     Allergies  Allergen Reactions  . Bee Venom Anaphylaxis    Current Outpatient Medications on File Prior to Visit  Medication Sig Dispense Refill  . diphenhydrAMINE (BENADRYL) 25 MG tablet Take 50 mg by mouth every 6 (six) hours as needed.    Marland Kitchen EPINEPHrine (AUVI-Q) 0.3 mg/0.3 mL IJ SOAJ injection Inject 0.3 mLs (0.3 mg total) into the muscle as needed for anaphylaxis. 1 each 0  . ibuprofen (ADVIL,MOTRIN) 200 MG  tablet Take 400 mg by mouth every 6 (six) hours as needed. For pain    . promethazine (PHENERGAN) 12.5 MG tablet Take 12.5 mg by mouth every 6 (six) hours as needed.     No current facility-administered medications on file prior to visit.    There were no vitals taken for this visit.   Objective:   Physical Exam  Constitutional: He appears well-nourished.  Respiratory: Effort normal.  Musculoskeletal:     Comments: Very mild swelling with evidence of healing contusion to right medial great toe. No erythema, drainage. He appears well.   Skin: No erythema.  Very mild swelling with  evidence of healing contusion to right medial great toe. No erythema, drainage. He appears well.            Assessment & Plan:

## 2019-11-09 NOTE — Patient Instructions (Signed)
Take oral NSAID's to reduce inflammation and swelling.  Please update me if anything changes.  It was a pleasure to see you today! Mayra Reel, NP-C

## 2019-12-22 ENCOUNTER — Ambulatory Visit (INDEPENDENT_AMBULATORY_CARE_PROVIDER_SITE_OTHER): Payer: No Typology Code available for payment source | Admitting: Primary Care

## 2019-12-22 ENCOUNTER — Other Ambulatory Visit: Payer: Self-pay

## 2019-12-22 ENCOUNTER — Encounter: Payer: Self-pay | Admitting: Primary Care

## 2019-12-22 VITALS — BP 120/84 | HR 66 | Temp 96.5°F | Ht 68.0 in | Wt 179.5 lb

## 2019-12-22 DIAGNOSIS — Z87898 Personal history of other specified conditions: Secondary | ICD-10-CM | POA: Diagnosis not present

## 2019-12-22 MED ORDER — ONDANSETRON 4 MG PO TBDP: 4.0000 mg | ORAL_TABLET | Freq: Three times a day (TID) | ORAL | 0 refills | Status: DC | PRN

## 2019-12-22 NOTE — Progress Notes (Signed)
Subjective:    Patient ID: Clifford Hall, male    DOB: January 15, 1986, 34 y.o.   MRN: [id number]  HPI  This visit occurred during the SARS-CoV-2 public health emergency.  Safety protocols were in place, including screening questions prior to the visit, additional usage of staff PPE, and extensive cleaning of exam room while observing appropriate contact time as indicated for disinfecting solutions.   Clifford Hall is a 34 year old male who presents today with a chief complaint of foreign body. He also has a question regarding post vasectomy pain, and is requesting a prescription for motion sickness.  The foreign body is located to the tip of the left 3rd digit. He thought the foreign body was wood. Last night he removed the foreign body with some sterilized clippers, removed it successfully. Two days ago he noticed some yellow drainage from the site, this has since resolved.   He underwent vasectomy in December 2020, since then has noticed pain to the site of his vasectomy (vas deferens) intermittently when he is jolted, jumps. He denies swelling and redness. He has noticed feeling small marbles to the site of his surgery. He plans on calling to schedule a follow up visit with the Urologist.   He has a history of motion sickness, will be riding in a vehicle to [place] next week and has to work on the way. He has tried Dramamine which was ineffective. He mostly experiences nausea during trips.  Review of Systems  Gastrointestinal:       History of motion sickness  Genitourinary:       Vasectomy site pain  Skin: Negative for color change.       Foreign body to left 3rd digit tip     Past Medical History:  Diagnosis Date  . History of asthma   . History of chickenpox   . History of headache   . Hymenoptera allergy      Social History   Socioeconomic History  . Marital status: Married    Spouse name: Not on file  . Number of children: Not on file  . Years of education:  Not on file  . Highest education level: Not on file  Occupational History  . Not on file  Tobacco Use  . Smoking status: Never Smoker  . Smokeless tobacco: Never Used  Substance and Sexual Activity  . Alcohol use: Yes    Comment: Occationally - once or twice monthly  . Drug use: No  . Sexual activity: Not on file  Other Topics Concern  . Not on file  Social History Narrative   Married.   3 children, 1 child on the way.   Works in Radiation protection practitioner.    Enjoys playing video games, spending time with family.   Social Determinants of Health   Financial Resource Strain:   . Difficulty of Paying Living Expenses: Not on file  Food Insecurity:   . Worried About Programme researcher, broadcasting/film/video in the Last Year: Not on file  . Ran Out of Food in the Last Year: Not on file  Transportation Needs:   . Lack of Transportation (Medical): Not on file  . Lack of Transportation (Non-Medical): Not on file  Physical Activity:   . Days of Exercise per Week: Not on file  . Minutes of Exercise per Session: Not on file  Stress:   . Feeling of Stress : Not on file  Social Connections:   . Frequency of Communication with Friends and  Family: Not on file  . Frequency of Social Gatherings with Friends and Family: Not on file  . Attends Religious Services: Not on file  . Active Member of Clubs or Organizations: Not on file  . Attends Archivist Meetings: Not on file  . Marital Status: Not on file  Intimate Partner Violence:   . Fear of Current or Ex-Partner: Not on file  . Emotionally Abused: Not on file  . Physically Abused: Not on file  . Sexually Abused: Not on file    Past Surgical History:  Procedure Laterality Date  . I & D EXTREMITY  09/28/2012   Procedure: IRRIGATION AND DEBRIDEMENT EXTREMITY;  Surgeon: Roseanne Kaufman, MD;  Location: [place];  Service: Orthopedics;  Laterality: Left;  . NERVE AND TENDON REPAIR  09/28/2012   Procedure: NERVE AND TENDON REPAIR;  Surgeon: Roseanne Kaufman, MD;   Location: [facility];  Service: Orthopedics;  Laterality: Left;  . WISDOM TOOTH EXTRACTION  2008    Family History  Problem Relation Age of Onset  . Kidney Stones Father   . Hyperlipidemia Father   . Gout Father   . Thyroid disease Sister   . Cancer Maternal Aunt   . Thyroid disease Maternal Aunt   . Heart disease Maternal Grandfather   . Stroke Maternal Grandfather   . Kidney Stones Maternal Grandfather   . Heart disease Paternal Grandfather   . Kidney Stones Paternal Grandfather   . Gout Paternal Grandfather   . Leukemia Paternal Grandfather   . Hyperlipidemia Mother   . Gout Paternal Grandmother     Allergies  Allergen Reactions  . Bee Venom Anaphylaxis    Current Outpatient Medications on File Prior to Visit  Medication Sig Dispense Refill  . diphenhydrAMINE (BENADRYL) 25 MG tablet Take 50 mg by mouth every 6 (six) hours as needed.    Marland Kitchen EPINEPHrine (AUVI-Q) 0.3 mg/0.3 mL IJ SOAJ injection Inject 0.3 mLs (0.3 mg total) into the muscle as needed for anaphylaxis. 1 each 0  . ibuprofen (ADVIL,MOTRIN) 200 MG tablet Take 400 mg by mouth every 6 (six) hours as needed. For pain    . promethazine (PHENERGAN) 12.5 MG tablet Take 12.5 mg by mouth every 6 (six) hours as needed.     No current facility-administered medications on file prior to visit.    BP 120/84   Pulse 66   Temp (!) 96.5 F (35.8 C) (Temporal)   Ht 5\' 8"  (1.727 m)   Wt 179 lb 8 oz (81.4 kg)   SpO2 98%   BMI 27.29 kg/m    Objective:   Physical Exam  Constitutional: He appears well-nourished.  Respiratory: Effort normal.  Skin: Skin is warm and dry. No erythema.  No foreign body noted to site of concern. No erythema or drainage to site.           Assessment & Plan:  Post Vasectomy Pain:  Since procedure in December 2020. I encourage him to follow up with his Urologist, he agrees and will schedule a visit.  Foreign Body:  To left 3rd digit tip. Today is appears that he has successfully removed  the foreign body and there is on sign of infection. Return precautions provided.   Pleas [name], NP

## 2019-12-22 NOTE — Assessment & Plan Note (Signed)
Rx provided for Zofran to use as needed. He will update.

## 2019-12-22 NOTE — Patient Instructions (Signed)
You can take the ondansetron (Zofran) every 8 hours as needed for motion sickness/nausea.   Please notify me if you notice redness, swelling, drainage to the site of the sliver.  It was a pleasure to see you today!

## 2020-01-02 ENCOUNTER — Telehealth: Payer: Self-pay

## 2020-01-02 NOTE — Telephone Encounter (Signed)
Called and spoke with the patient. He has been sent up for a MyChart video visit next month to discuss further.

## 2020-01-02 NOTE — Telephone Encounter (Signed)
Please have him come in for an office visit and we can discuss these issues.  He will need to go back through build up again.  I do not believe that will be necessary for him to be retested at this point.  Thanks.

## 2020-01-02 NOTE — Telephone Encounter (Signed)
Patient called and is interested in restart his venom injections. Patient was on maintenance at every 6 weeks. He last received a full dose on 09/14/2018 however he did come in 11/22/2018 and due to time we had to split the dose however he was only able to stay for the first dose and not the second. Patient came back on 11/29/2018 to receive the second split dose. Patient is wondering if he will restart at maintenance, or if he will have to build up again, or be retested prior to starting? I did inform patient that he has not been seen since 08/02/2018 and would need to be seen prior to restarting. Please advise.

## 2020-01-30 ENCOUNTER — Ambulatory Visit (INDEPENDENT_AMBULATORY_CARE_PROVIDER_SITE_OTHER)
Admission: RE | Admit: 2020-01-30 | Discharge: 2020-01-30 | Disposition: A | Discharge status: Home / Self Care | Payer: No Typology Code available for payment source | Source: Ambulatory Visit | Attending: Family Medicine | Admitting: Family Medicine

## 2020-01-30 ENCOUNTER — Other Ambulatory Visit: Payer: Self-pay

## 2020-01-30 ENCOUNTER — Telehealth (INDEPENDENT_AMBULATORY_CARE_PROVIDER_SITE_OTHER): Payer: No Typology Code available for payment source | Admitting: Allergy and Immunology

## 2020-01-30 ENCOUNTER — Ambulatory Visit (INDEPENDENT_AMBULATORY_CARE_PROVIDER_SITE_OTHER): Payer: No Typology Code available for payment source | Admitting: Family Medicine

## 2020-01-30 ENCOUNTER — Encounter: Payer: Self-pay | Admitting: Family Medicine

## 2020-01-30 ENCOUNTER — Ambulatory Visit: Payer: No Typology Code available for payment source | Admitting: Family Medicine

## 2020-01-30 ENCOUNTER — Ambulatory Visit: Payer: Self-pay | Admitting: Allergy and Immunology

## 2020-01-30 ENCOUNTER — Encounter: Payer: Self-pay | Admitting: Allergy and Immunology

## 2020-01-30 VITALS — BP 104/70 | HR 61 | Temp 98.2°F | Ht 68.0 in | Wt 182.5 lb

## 2020-01-30 DIAGNOSIS — S62316A Displaced fracture of base of fifth metacarpal bone, right hand, initial encounter for closed fracture: Secondary | ICD-10-CM | POA: Diagnosis not present

## 2020-01-30 DIAGNOSIS — Z91030 Bee allergy status: Secondary | ICD-10-CM

## 2020-01-30 DIAGNOSIS — Z91038 Other insect allergy status: Secondary | ICD-10-CM

## 2020-01-30 DIAGNOSIS — J4520 Mild intermittent asthma, uncomplicated: Secondary | ICD-10-CM | POA: Diagnosis not present

## 2020-01-30 DIAGNOSIS — M79641 Pain in right hand: Secondary | ICD-10-CM

## 2020-01-30 MED ORDER — EPINEPHRINE 0.3 MG/0.3ML IJ SOAJ: 0.3000 mg | INTRAMUSCULAR | 0 refills | Status: DC | PRN

## 2020-01-30 MED ORDER — ALBUTEROL SULFATE HFA 108 (90 BASE) MCG/ACT IN AERS: 2.0000 | INHALATION_SPRAY | Freq: Four times a day (QID) | RESPIRATORY_TRACT | 1 refills | Status: DC | PRN

## 2020-01-30 NOTE — Progress Notes (Signed)
Clifford Marcotte T. Shundra Wirsing, MD, CAQ Sports Medicine  Primary Care and Sports Medicine [facility] at [facility] [address] [place], [address]  Phone: [phone]  FAX: [phone]  Clifford Hall - 34 y.o. male  MRN [id number]  Date of Birth: Mar 18, 1986  Date: 01/30/2020  PCP: Doreene Nest, NP  Referral: Doreene Nest, NP  Chief Complaint  Patient presents with  . Hand Injury    Right    This visit occurred during the SARS-CoV-2 public health emergency.  Safety protocols were in place, including screening questions prior to the visit, additional usage of staff PPE, and extensive cleaning of exam room while observing appropriate contact time as indicated for disinfecting solutions.   Subjective:   Clifford Hall is a 34 y.o. very pleasant male patient with Body mass index is 27.75 kg/m. who presents with the following:  R hand pain, punched wall.  Able to offload 2,000 pounds of garbage to the landfill earlier this morning.  He is a pleasant gentleman, and he punched a wall yesterday.  Since then he said some swelling, bruising and pain in the hand more on the dorsal aspect in the last 3 through 5 metacarpals.  He does not have any pain at the wrist or at the distal radius or ulna.  He is not having any pain in his phalanges.  He does have a history of prior hand surgery done by Dr. Onalee Hua.  Review of Systems is noted in the HPI, as appropriate   Objective:   BP 104/70   Pulse 61   Temp 98.2 F (36.8 C) (Temporal)   Ht 5\' 8"  (1.727 m)   Wt 182 lb 8 oz (82.8 kg)   SpO2 98%   BMI 27.75 kg/m   GEN: No acute distress; alert,appropriate. PULM: Breathing comfortably in no respiratory distress PSYCH: Normally interactive.   Right hand: Nontender at the wrist, scaphoid, distal radius and ulna.  Moves the elbow fine with no difficulty, nontender throughout the entirety of the radius and ulna.  Nontender at the  first metacarpal, all phalanges.  Nontender on 2 through 3 metacarpals.  Modestly tender along the fourth metacarpal.  Moderately tender along the fifth metacarpal.  There is ecchymosis and swelling throughout the hand.  Radiology: No results found.  Assessment and Plan:     ICD-10-CM   1. Closed displaced fracture of base of fifth metacarpal bone of right hand, initial encounter  S62.316A Ambulatory referral to Hand Surgery    CANCELED: Ambulatory referral to Hand Surgery  2. Hand pain, right  M79.641 DG Hand Complete Right    Ambulatory referral to Hand Surgery    CANCELED: Ambulatory referral to Hand Surgery   Total encounter time: 30 minutes. On the day of the patient encounter, this can include review of prior records, labs, and imaging.  Additional time can include counselling, consultation with peer MD in person or by telephone.  This also includes independent review of Radiology.  He has an apex dorsal displaced fracture with angulation of approximately 40 to 50 degrees.  I placed him in an ulnar gutter splint without difficulty.  This was a fiberglass splint and molded and held in place with Ace bandage.  I would like for him to see hand surgery, he is an established patient at [facility] orthopedics.  We are working on getting an appointment now.  He understands this and recognizes that he needs to follow-up with a  subspecialist for his care.  Follow-up: No follow-ups on file.  No orders of the defined types were placed in this encounter.  There are no discontinued medications. Orders Placed This Encounter  Procedures  . DG Hand Complete Right  . Ambulatory referral to Hand Surgery    Signed,  Frederico Hamman T. Brookelynn Hamor, MD   Outpatient Encounter Medications as of 01/30/2020  Medication Sig  . diphenhydrAMINE (BENADRYL) 25 MG tablet Take 50 mg by mouth every 6 (six) hours as needed.  Marland Kitchen EPINEPHrine (AUVI-Q) 0.3 mg/0.3 mL IJ SOAJ injection Inject 0.3 mLs (0.3 mg total) into  the muscle as needed for anaphylaxis.  Marland Kitchen ibuprofen (ADVIL,MOTRIN) 200 MG tablet Take 400 mg by mouth every 6 (six) hours as needed. For pain  . ondansetron (ZOFRAN ODT) 4 MG disintegrating tablet Take 1 tablet (4 mg total) by mouth every 8 (eight) hours as needed for nausea or vomiting.   No facility-administered encounter medications on file as of 01/30/2020.

## 2020-01-30 NOTE — Progress Notes (Signed)
Follow-up Telemedicine Note  RE: Clifford Hall MRN: [id number] DOB: 10-Oct-1986 Date of Telemedicine Visit: 01/30/2020  Primary care provider: Doreene Nest, NP Referring provider: Doreene Nest, NP  Telemedicine Follow Up Visit via Telephone: I connected with Clifford Hall for a follow up on 01/30/20 by telephone and verified that I am speaking with the correct person using two identifiers.   The limitations, risks, security and privacy concerns of performing an evaluation and management service by telemedicine, the availability of in person appointments, and that there may be a patient responsible charge related to this service were discussed. The patient expressed understanding and agreed to proceed.  Patient is at home.  Provider is at the office.  Visit start time: 5:04 PM Visit end time: 5:30 PM Insurance consent/check in by: [facility] Medical consent and medical assistant/nurse: Darreld Mclean  History of present illness: Clifford Hall is a 34 y.o. 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  He was previously seen in this clinic in October 2019.  He reports that he had been receiving venom immunotherapy injections without problems or complications every 6 weeks until November 2019.  At that time, traveling for work interfered with his injections, and then with the COVID-19 pandemic he did not resume injections.  He is interested in restarting the venom immunotherapy injections.  He has had no stings in the interval since his previous visit. He reports that when he is running, or performing other exercise or sustained physical exertion, he has to stop to catch his breath with "chest heaving."  He reports that this problem has progressed over the past year.  Assessment and plan: Hymenoptera venom allergy  A laboratory order form has been provided for baseline serum  tryptase level and serum specific IgE against hymenoptera panel.  The patient will be called when lab results have returned.  For now, continue careful avoidance of flying insects and have access to epinephrine autoinjectors.  A refill prescription has been provided for epinephrine 0.3 mg autoinjector (AuviQ) 2 pack along with instructions for its proper administration.  Mild intermittent asthma/exercise-induced bronchospasm  Continue albuterol HFA, 1 to 2 inhalations every 4-6 hours if needed.  I have also recommended albuterol, 1 to 2 inhalations, approximately 15 minutes prior to exercise.  If this problem persists or progresses despite treatment plan as outlined above, follow-up for spirometry.   Meds ordered this encounter  Medications  . albuterol (VENTOLIN HFA) 108 (90 Base) MCG/ACT inhaler    Sig: Inhale 2 puffs into the lungs every 6 (six) hours as needed for wheezing or shortness of breath.    Dispense:  18 g    Refill:  1  . EPINEPHrine (AUVI-Q) 0.3 mg/0.3 mL IJ SOAJ injection    Sig: Inject 0.3 mLs (0.3 mg total) into the muscle as needed for anaphylaxis.    Dispense:  1 each    Refill:  0    Diagnostics: None.   Physical examination: Physical Exam Not obtained as encounter was done via telephone.   The following portions of the patient's history were reviewed and updated as appropriate: allergies, current medications, past family history, past medical history, past social history, past surgical history and problem list.  Allergies as of 01/30/2020      Reactions   Bee Venom Anaphylaxis      Medication List       Accurate as of January 30, 2020 10:21 PM. If you  have any questions, ask your nurse or doctor.        albuterol 108 (90 Base) MCG/ACT inhaler Commonly known as: Ventolin HFA Inhale 2 puffs into the lungs every 6 (six) hours as needed for wheezing or shortness of breath. Started by: Edmonia Lynch, MD   diphenhydrAMINE 25 MG tablet Commonly  known as: BENADRYL Take 50 mg by mouth every 6 (six) hours as needed.   EPINEPHrine 0.3 mg/0.3 mL Soaj injection Commonly known as: Auvi-Q Inject 0.3 mLs (0.3 mg total) into the muscle as needed for anaphylaxis.   ibuprofen 200 MG tablet Commonly known as: ADVIL Take 400 mg by mouth every 6 (six) hours as needed. For pain   ondansetron 4 MG disintegrating tablet Commonly known as: Zofran ODT Take 1 tablet (4 mg total) by mouth every 8 (eight) hours as needed for nausea or vomiting.       Allergies  Allergen Reactions  . Bee Venom Anaphylaxis    Previous notes and tests were reviewed.  I discussed the assessment and treatment plan with the patient. The patient was provided an opportunity to ask questions and all were answered. The patient agreed with the plan and demonstrated an understanding of the instructions.   The patient was advised to call back or seek an in-person evaluation if the symptoms worsen or if the condition fails to improve as anticipated.  I provided 25 minutes of non-face-to-face time during this encounter.  I appreciate the opportunity to take part in [name]'s care. Please do not hesitate to contact me with questions.  Sincerely,   R. Edgar Frisk, MD

## 2020-01-30 NOTE — Patient Instructions (Signed)
Hymenoptera venom allergy  A laboratory order form has been provided for baseline serum tryptase level and serum specific IgE against hymenoptera panel.  The patient will be called when lab results have returned.  For now, continue careful avoidance of flying insects and have access to epinephrine autoinjectors.  A refill prescription has been provided for epinephrine 0.3 mg autoinjector (AuviQ) 2 pack along with instructions for its proper administration.  Mild intermittent asthma/exercise-induced bronchospasm  Continue albuterol HFA, 1 to 2 inhalations every 4-6 hours if needed.  I have also recommended albuterol, 1 to 2 inhalations, approximately 15 minutes prior to exercise.  If this problem persists or progresses despite treatment plan as outlined above, follow-up for spirometry.   When lab results have returned you will be called with further recommendations. With the newly implemented Cures Act, the labs may be visible to you at the same time they become visible to [place]. However, the results will typically not be addressed until all of the results are back, so please be patient.  Until you have heard from [place], please continue the treatment plan as outlined on your take home sheet.

## 2020-01-30 NOTE — Patient Instructions (Signed)
REFERRALS TO SPECIALISTS, SPECIAL TESTS (MRI, CT, ULTRASOUNDS)  MARION or  Charmaine will help you.   During the  Covid-19 outbreak we are not having people meet with the patient care coordinators for their protection.  They will call you when your appointment is made.   In the most extreme case (like a stroke), I will try to get them to talk to you in the office, but some insurances require paperwork and authorizations.  This will always take some time.  Specialist appointment times vary a great deal, based on their schedule / openings. -- Some specialists have very long wait times. (Example. Dermatology)    

## 2020-01-30 NOTE — Assessment & Plan Note (Signed)
   A laboratory order form has been provided for baseline serum tryptase level and serum specific IgE against hymenoptera panel.  The patient will be called when lab results have returned.  For now, continue careful avoidance of flying insects and have access to epinephrine autoinjectors.  A refill prescription has been provided for epinephrine 0.3 mg autoinjector (AuviQ) 2 pack along with instructions for its proper administration.

## 2020-01-30 NOTE — Assessment & Plan Note (Signed)
   Continue albuterol HFA, 1 to 2 inhalations every 4-6 hours if needed.  I have also recommended albuterol, 1 to 2 inhalations, approximately 15 minutes prior to exercise.  If this problem persists or progresses despite treatment plan as outlined above, follow-up for spirometry.

## 2020-01-31 MED FILL — EPINEPHRINE 0.3 MG AUTO-INJ: 0.3 | 15 days supply | Qty: 2 | Fill #0

## 2020-01-31 MED FILL — ALBUTEROL SULFATE HFA 108 (: 108 (90 BAS | 25 days supply | Qty: 18 | Fill #0

## 2020-02-03 LAB — ALLERGEN HYMENOPTERA PANEL
Bumblebee: <0.1 kU/L
Honeybee IgE: <0.1 kU/L
Hornet, White Face, IgE: 1.03 kU/L — AB
Hornet, Yellow, IgE: 0.43 kU/L — AB
Paper Wasp IgE: 1.56 kU/L — AB
Yellow Jacket, IgE: 0.92 kU/L — AB

## 2020-02-03 LAB — TRYPTASE: Tryptase: 2.6 ug/L (ref 2.2–13.2)

## 2020-02-15 MED FILL — ALBUTEROL SULFATE HFA 108 (: 108 (90 BAS | 25 days supply | Qty: 18 | Fill #0

## 2020-02-22 ENCOUNTER — Other Ambulatory Visit: Payer: Self-pay | Admitting: Primary Care

## 2020-02-22 DIAGNOSIS — Z0000 Encounter for general adult medical examination without abnormal findings: Secondary | ICD-10-CM

## 2020-02-23 ENCOUNTER — Other Ambulatory Visit (INDEPENDENT_AMBULATORY_CARE_PROVIDER_SITE_OTHER): Payer: No Typology Code available for payment source

## 2020-02-23 DIAGNOSIS — Z0000 Encounter for general adult medical examination without abnormal findings: Secondary | ICD-10-CM

## 2020-02-23 LAB — CBC
HCT: 41 % (ref 39.0–52.0)
Hemoglobin: 13.8 g/dL (ref 13.0–17.0)
MCHC: 33.8 g/dL (ref 30.0–36.0)
MCV: 86.5 fl (ref 78.0–100.0)
Platelets: 353 10*3/uL (ref 150.0–400.0)
RBC: 4.74 Mil/uL (ref 4.22–5.81)
RDW: 14.4 % (ref 11.5–15.5)
WBC: 5.8 10*3/uL (ref 4.0–10.5)

## 2020-02-23 LAB — COMPREHENSIVE METABOLIC PANEL
ALT: 25 U/L (ref 0–53)
AST: 18 U/L (ref 0–37)
Albumin: 4.6 g/dL (ref 3.5–5.2)
Alkaline Phosphatase: 76 U/L (ref 39–117)
BUN: 15 mg/dL (ref 6–23)
CO2: 27 mEq/L (ref 19–32)
Calcium: 10 mg/dL (ref 8.4–10.5)
Chloride: 105 mEq/L (ref 96–112)
Creatinine, Ser: 0.89 mg/dL (ref 0.40–1.50)
GFR: 99.05 mL/min (ref >60.00)
Glucose, Bld: 96 mg/dL (ref 70–99)
Potassium: 4.3 mEq/L (ref 3.5–5.1)
Sodium: 139 mEq/L (ref 135–145)
Total Bilirubin: 0.6 mg/dL (ref 0.2–1.2)
Total Protein: 7.6 g/dL (ref 6.0–8.3)

## 2020-02-23 LAB — LIPID PANEL
Cholesterol: 222 mg/dL — ABNORMAL HIGH (ref 0–200)
HDL: 49.4 mg/dL (ref >39.00)
LDL Cholesterol: 147 mg/dL — ABNORMAL HIGH (ref 0–99)
NonHDL: 172.73
Total CHOL/HDL Ratio: 4
Triglycerides: 127 mg/dL (ref 0.0–149.0)
VLDL: 25.4 mg/dL (ref 0.0–40.0)

## 2020-02-29 ENCOUNTER — Ambulatory Visit (INDEPENDENT_AMBULATORY_CARE_PROVIDER_SITE_OTHER): Payer: No Typology Code available for payment source | Admitting: Primary Care

## 2020-02-29 ENCOUNTER — Encounter: Payer: Self-pay | Admitting: Primary Care

## 2020-02-29 ENCOUNTER — Other Ambulatory Visit: Payer: Self-pay

## 2020-02-29 VITALS — BP 116/76 | HR 82 | Temp 96.2°F | Ht 68.0 in | Wt 182.8 lb

## 2020-02-29 DIAGNOSIS — J4520 Mild intermittent asthma, uncomplicated: Secondary | ICD-10-CM

## 2020-02-29 DIAGNOSIS — F331 Major depressive disorder, recurrent, moderate: Secondary | ICD-10-CM

## 2020-02-29 DIAGNOSIS — F329 Major depressive disorder, single episode, unspecified: Secondary | ICD-10-CM | POA: Insufficient documentation

## 2020-02-29 DIAGNOSIS — E785 Hyperlipidemia, unspecified: Secondary | ICD-10-CM

## 2020-02-29 DIAGNOSIS — Z0000 Encounter for general adult medical examination without abnormal findings: Secondary | ICD-10-CM | POA: Diagnosis not present

## 2020-02-29 DIAGNOSIS — Z91038 Other insect allergy status: Secondary | ICD-10-CM

## 2020-02-29 DIAGNOSIS — Z91030 Bee allergy status: Secondary | ICD-10-CM

## 2020-02-29 DIAGNOSIS — F411 Generalized anxiety disorder: Secondary | ICD-10-CM | POA: Insufficient documentation

## 2020-02-29 MED ORDER — CITALOPRAM HYDROBROMIDE 20 MG PO TABS: 20.0000 mg | ORAL_TABLET | Freq: Every day | ORAL | 1 refills | Status: DC

## 2020-02-29 NOTE — Assessment & Plan Note (Signed)
Chronic for the last 18 months. PHQ 9 score of 17 today.  Discussed options for treatment including therapy and medication, he opts for medication. Rx for citalopram 20 mg sent to pharmacy.  Patient is to take 1/2 tablet daily for 8 days, then advance to 1 full tablet thereafter. We discussed possible side effects of headache, GI upset, drowsiness, and SI/HI. If thoughts of SI/HI develop, we discussed to present to the emergency immediately. Patient verbalized understanding.   Follow up in 6 weeks for re-evaluation.

## 2020-02-29 NOTE — Progress Notes (Signed)
Subjective:    Patient ID: Clifford Hall, male    DOB: May 02, 1986, 34 y.o.   MRN: [id number]  HPI  This visit occurred during the SARS-CoV-2 public health emergency.  Safety protocols were in place, including screening questions prior to the visit, additional usage of staff PPE, and extensive cleaning of exam room while observing appropriate contact time as indicated for disinfecting solutions.   [name] is a 34 year old male who presents today for complete physical. He would also like to discuss symptoms of depression.   Symptoms include anger outbursts, inability to control emotions, feeling down, worry, feeling anxious, irritability. He has punched walls which have caused injury. Symptoms began about 18 months ago.He has four small children at home which is challenging, also trying to support his wife with her depression, also a Retail banker. He is finding himself increasing alcohol intake to take the edge off. PHQ 9 score of 17 today and GAD 7 score of 18 today.   Immunizations: -Tetanus: Completed in 2018 -Influenza: Completed last season -Covid-19: Completed series  Diet: He endorses a fair diet, some take out food and trying to make better choices.  Exercise: He is not exercising but is active during the day.  Eye exam: No recent exam Dental exam: No recent exam  BP Readings from Last 3 Encounters:  02/29/20 116/76  01/30/20 104/70  12/22/19 120/84     Review of Systems  Constitutional: Negative for unexpected weight change.  HENT: Negative for rhinorrhea.   Respiratory: Negative for cough and shortness of breath.   Cardiovascular: Negative for chest pain.  Gastrointestinal: Negative for constipation and diarrhea.  Genitourinary: Negative for difficulty urinating.  Musculoskeletal: Negative for arthralgias and myalgias.  Skin: Negative for rash.  Allergic/Immunologic: Negative for environmental allergies.  Neurological: Negative for  dizziness, numbness and headaches.  Psychiatric/Behavioral: The patient is nervous/anxious.        See HPI       Past Medical History:  Diagnosis Date  . Asthma   . History of asthma   . History of chickenpox   . History of headache   . Hymenoptera allergy      Social History   Socioeconomic History  . Marital status: Married    Spouse name: Not on file  . Number of children: Not on file  . Years of education: Not on file  . Highest education level: Not on file  Occupational History  . Not on file  Tobacco Use  . Smoking status: Never Smoker  . Smokeless tobacco: Never Used  Substance and Sexual Activity  . Alcohol use: Yes    Comment: Occationally - once or twice monthly  . Drug use: No  . Sexual activity: Not on file  Other Topics Concern  . Not on file  Social History Narrative   Married.   3 children, 1 child on the way.   Works in Radiation protection practitioner.    Enjoys playing video games, spending time with family.   Social Determinants of Health   Financial Resource Strain:   . Difficulty of Paying Living Expenses:   Food Insecurity:   . Worried About Programme researcher, broadcasting/film/video in the Last Year:   . Barista in the Last Year:   Transportation Needs:   . Freight forwarder (Medical):   Marland Kitchen Lack of Transportation (Non-Medical):   Physical Activity:   . Days of Exercise per Week:   . Minutes of Exercise per  Session:   Stress:   . Feeling of Stress :   Social Connections:   . Frequency of Communication with Friends and Family:   . Frequency of Social Gatherings with Friends and Family:   . Attends Religious Services:   . Active Member of Clubs or Organizations:   . Attends Banker Meetings:   Marland Kitchen Marital Status:   Intimate Partner Violence:   . Fear of Current or Ex-Partner:   . Emotionally Abused:   Marland Kitchen Physically Abused:   . Sexually Abused:     Past Surgical History:  Procedure Laterality Date  . I & D EXTREMITY  09/28/2012   Procedure:  IRRIGATION AND DEBRIDEMENT EXTREMITY;  Surgeon: Dominica Severin, MD;  Location: [facility] OR;  Service: Orthopedics;  Laterality: Left;  . NERVE AND TENDON REPAIR  09/28/2012   Procedure: NERVE AND TENDON REPAIR;  Surgeon: Dominica Severin, MD;  Location: [facility] OR;  Service: Orthopedics;  Laterality: Left;  . WISDOM TOOTH EXTRACTION  2008    Family History  Problem Relation Age of Onset  . Kidney Stones Father   . Hyperlipidemia Father   . Gout Father   . Thyroid disease Sister   . Cancer Maternal Aunt   . Thyroid disease Maternal Aunt   . Heart disease Maternal Grandfather   . Stroke Maternal Grandfather   . Kidney Stones Maternal Grandfather   . Heart disease Paternal Grandfather   . Kidney Stones Paternal Grandfather   . Gout Paternal Grandfather   . Leukemia Paternal Grandfather   . Hyperlipidemia Mother   . Gout Paternal Grandmother     Allergies  Allergen Reactions  . Bee Venom Anaphylaxis    Current Outpatient Medications on File Prior to Visit  Medication Sig Dispense Refill  . albuterol (VENTOLIN HFA) 108 (90 Base) MCG/ACT inhaler Inhale 2 puffs into the lungs every 6 (six) hours as needed for wheezing or shortness of breath. 18 g 1  . diphenhydrAMINE (BENADRYL) 25 MG tablet Take 50 mg by mouth every 6 (six) hours as needed.    Marland Kitchen EPINEPHrine (AUVI-Q) 0.3 mg/0.3 mL IJ SOAJ injection Inject 0.3 mLs (0.3 mg total) into the muscle as needed for anaphylaxis. 1 each 0  . ibuprofen (ADVIL,MOTRIN) 200 MG tablet Take 400 mg by mouth every 6 (six) hours as needed. For pain    . ondansetron (ZOFRAN ODT) 4 MG disintegrating tablet Take 1 tablet (4 mg total) by mouth every 8 (eight) hours as needed for nausea or vomiting. (Patient not taking: Reported on 02/29/2020) 12 tablet 0   No current facility-administered medications on file prior to visit.    BP 116/76   Pulse 82   Temp (!) 96.2 F (35.7 C) (Temporal)   Ht 5\' 8"  (1.727 m)   Wt 182 lb 12 oz (82.9 kg)   SpO2 98%   BMI 27.79 kg/m     Objective:   Physical Exam  Constitutional: He is oriented to person, place, and time. He appears well-nourished.  HENT:  Right Ear: Tympanic membrane and ear canal normal.  Left Ear: Tympanic membrane and ear canal normal.  Mouth/Throat: Oropharynx is clear and moist.  Eyes: Pupils are equal, round, and reactive to light. EOM are normal.  Cardiovascular: Normal rate and regular rhythm.  Respiratory: Effort normal and breath sounds normal.  GI: Soft. Bowel sounds are normal. There is no abdominal tenderness.  Musculoskeletal:        General: Normal range of motion.     Cervical back:  Neck supple.  Neurological: He is alert and oriented to person, place, and time. No cranial nerve deficit.  Reflex Scores:      Patellar reflexes are 2+ on the right side and 2+ on the left side. Skin: Skin is warm and dry.  Psychiatric: He has a normal mood and affect.           Assessment & Plan:

## 2020-02-29 NOTE — Assessment & Plan Note (Signed)
Chronic for 18 months. GAD 7 score of 18 today.  Discussed options for treatment including therapy and medication, he opts for medication. Rx for citalopram 20 mg sent to pharmacy.  Patient is to take 1/2 tablet daily for 8 days, then advance to 1 full tablet thereafter. We discussed possible side effects of headache, GI upset, drowsiness, and SI/HI. If thoughts of SI/HI develop, we discussed to present to the emergency immediately. Patient verbalized understanding.   Follow up in 6 weeks for re-evaluation.

## 2020-02-29 NOTE — Assessment & Plan Note (Signed)
Following with allergist, may or may not resume immunotherapy.

## 2020-02-29 NOTE — Patient Instructions (Signed)
Start citalopram 20 mg once daily for anxiety and depression. Start by taking 1/2 tablet daily for 6-8 days, then increase to 1 full tablet thereafter.  Continue exercising. You should be getting 150 minutes of moderate intensity exercise weekly.  Continue to work on a healthy diet. Ensure you are consuming 64 ounces of water daily.  Please schedule a follow up visit for 6 weeks as discussed.   It was a pleasure to see you today!   Preventive Care 8-60 Years Old, Male Preventive care refers to lifestyle choices and visits with your health care provider that can promote health and wellness. This includes:  A yearly physical exam. This is also called an annual well check.  Regular dental and eye exams.  Immunizations.  Screening for certain conditions.  Healthy lifestyle choices, such as eating a healthy diet, getting regular exercise, not using drugs or products that contain nicotine and tobacco, and limiting alcohol use. What can I expect for my preventive care visit? Physical exam Your health care provider will check:  Height and weight. These may be used to calculate body mass index (BMI), which is a measurement that tells if you are at a healthy weight.  Heart rate and blood pressure.  Your skin for abnormal spots. Counseling Your health care provider may ask you questions about:  Alcohol, tobacco, and drug use.  Emotional well-being.  Home and relationship well-being.  Sexual activity.  Eating habits.  Work and work Statistician. What immunizations do I need?  Influenza (flu) vaccine  This is recommended every year. Tetanus, diphtheria, and pertussis (Tdap) vaccine  You may need a Td booster every 10 years. Varicella (chickenpox) vaccine  You may need this vaccine if you have not already been vaccinated. Human papillomavirus (HPV) vaccine  If recommended by your health care provider, you may need three doses over 6 months. Measles, mumps, and rubella  (MMR) vaccine  You may need at least one dose of MMR. You may also need a second dose. Meningococcal conjugate (MenACWY) vaccine  One dose is recommended if you are 8-25 years old and a Market researcher living in a residence hall, or if you have one of several medical conditions. You may also need additional booster doses. Pneumococcal conjugate (PCV13) vaccine  You may need this if you have certain conditions and were not previously vaccinated. Pneumococcal polysaccharide (PPSV23) vaccine  You may need one or two doses if you smoke cigarettes or if you have certain conditions. Hepatitis A vaccine  You may need this if you have certain conditions or if you travel or work in places where you may be exposed to hepatitis A. Hepatitis B vaccine  You may need this if you have certain conditions or if you travel or work in places where you may be exposed to hepatitis B. Haemophilus influenzae type b (Hib) vaccine  You may need this if you have certain risk factors. You may receive vaccines as individual doses or as more than one vaccine together in one shot (combination vaccines). Talk with your health care provider about the risks and benefits of combination vaccines. What tests do I need? Blood tests  Lipid and cholesterol levels. These may be checked every 5 years starting at age 39.  Hepatitis C test.  Hepatitis B test. Screening   Diabetes screening. This is done by checking your blood sugar (glucose) after you have not eaten for a while (fasting).  Sexually transmitted disease (STD) testing. Talk with your health care provider about  your test results, treatment options, and if necessary, the need for more tests. Follow these instructions at home: Eating and drinking   Eat a diet that includes fresh fruits and vegetables, whole grains, lean protein, and low-fat dairy products.  Take vitamin and mineral supplements as recommended by your health care provider.  Do  not drink alcohol if your health care provider tells you not to drink.  If you drink alcohol: ? Limit how much you have to 0-2 drinks a day. ? Be aware of how much alcohol is in your drink. In the [place] one drink equals one 12 oz bottle of beer (355 mL), one 5 oz glass of wine (148 mL), or one 1 oz glass of hard liquor (44 mL). Lifestyle  Take daily care of your teeth and gums.  Stay active. Exercise for at least 30 minutes on 5 or more days each week.  Do not use any products that contain nicotine or tobacco, such as cigarettes, e-cigarettes, and chewing tobacco. If you need help quitting, ask your health care provider.  If you are sexually active, practice safe sex. Use a condom or other form of protection to prevent STIs (sexually transmitted infections). What's next?  Go to your health care provider once a year for a well check visit.  Ask your health care provider how often you should have your eyes and teeth checked.  Stay up to date on all vaccines. This information is not intended to replace advice given to you by your health care provider. Make sure you discuss any questions you have with your health care provider. Document Revised: 09/30/2018 Document Reviewed: 09/30/2018 Elsevier Patient Education  2020 [employer].

## 2020-02-29 NOTE — Assessment & Plan Note (Signed)
Immunizations UTD. Commended him on regular activity, encouraged a healthy diet.  Exam today unremarkable. Labs reviewed.

## 2020-02-29 NOTE — Assessment & Plan Note (Signed)
LDL of 147 on recent labs. Encouraged a healthy diet and regular exercise.  Continue to monitor.

## 2020-02-29 NOTE — Assessment & Plan Note (Signed)
Using albuterol sparingly as needed. No wheezing on exam.  Continue to monitor.

## 2020-03-02 ENCOUNTER — Other Ambulatory Visit: Payer: No Typology Code available for payment source

## 2020-03-08 ENCOUNTER — Other Ambulatory Visit: Payer: No Typology Code available for payment source

## 2020-03-09 ENCOUNTER — Encounter: Payer: No Typology Code available for payment source | Admitting: Primary Care

## 2020-03-14 ENCOUNTER — Encounter: Payer: No Typology Code available for payment source | Admitting: Primary Care

## 2020-03-30 ENCOUNTER — Ambulatory Visit (INDEPENDENT_AMBULATORY_CARE_PROVIDER_SITE_OTHER): Payer: No Typology Code available for payment source | Admitting: Primary Care

## 2020-03-30 ENCOUNTER — Encounter: Payer: Self-pay | Admitting: Primary Care

## 2020-03-30 ENCOUNTER — Other Ambulatory Visit: Payer: Self-pay

## 2020-03-30 VITALS — BP 110/66 | HR 74 | Temp 95.9°F | Ht 68.0 in | Wt 180.5 lb

## 2020-03-30 DIAGNOSIS — L237 Allergic contact dermatitis due to plants, except food: Secondary | ICD-10-CM

## 2020-03-30 HISTORY — DX: Allergic contact dermatitis due to plants, except food: L23.7

## 2020-03-30 MED ORDER — PREDNISONE 20 MG PO TABS: ORAL_TABLET | ORAL | 0 refills | Status: DC

## 2020-03-30 NOTE — Patient Instructions (Signed)
Start prednisone tablets. Take 2 tablets daily for four days, then 1 tablet daily for four days.  It was a pleasure to see you today!

## 2020-03-30 NOTE — Assessment & Plan Note (Signed)
Rash today representative of poison ivy dermatitis. No improvement with OTC treatment. Rx for prednisone course sent to pharmacy.

## 2020-03-30 NOTE — Progress Notes (Signed)
Subjective:    Patient ID: Clifford Hall, male    DOB: 11/02/1985, 34 y.o.   MRN: [id number]  HPI  This visit occurred during the SARS-CoV-2 public health emergency.  Safety protocols were in place, including screening questions prior to the visit, additional usage of staff PPE, and extensive cleaning of exam room while observing appropriate contact time as indicated for disinfecting solutions.   [name] is a 34 year old male who presents today with a chief complaint of rash.  He came in contact with poison ivy five days ago, brushed up against a tree that had poison ivy vines. The following day he noticed a rash with itching develop to his right forearm. Over the week he's noticed rashes to the left upper extremity, right hand. He's used OTC cortisone cream, and other OTC treatments without improvement.   He typically requires oral prednisone in order to treat his poison ivy rashes. He's washed all clothing since his encounter with poison ivy.   Review of Systems  Respiratory: Negative for shortness of breath and wheezing.   Skin: Positive for rash.       Past Medical History:  Diagnosis Date  . Asthma   . History of asthma   . History of chickenpox   . History of headache   . Hymenoptera allergy      Social History   Socioeconomic History  . Marital status: Married    Spouse name: Not on file  . Number of children: Not on file  . Years of education: Not on file  . Highest education level: Not on file  Occupational History  . Not on file  Tobacco Use  . Smoking status: Never Smoker  . Smokeless tobacco: Never Used  Vaping Use  . Vaping Use: Never used  Substance and Sexual Activity  . Alcohol use: Yes    Comment: Occationally - once or twice monthly  . Drug use: No  . Sexual activity: Not on file  Other Topics Concern  . Not on file  Social History Narrative   Married.   3 children, 1 child on the way.   Works in Tax adviser.    Enjoys  playing video games, spending time with family.   Social Determinants of Health   Financial Resource Strain:   . Difficulty of Paying Living Expenses:   Food Insecurity:   . Worried About Charity fundraiser in the Last Year:   . Arboriculturist in the Last Year:   Transportation Needs:   . Film/video editor (Medical):   Marland Kitchen Lack of Transportation (Non-Medical):   Physical Activity:   . Days of Exercise per Week:   . Minutes of Exercise per Session:   Stress:   . Feeling of Stress :   Social Connections:   . Frequency of Communication with Friends and Family:   . Frequency of Social Gatherings with Friends and Family:   . Attends Religious Services:   . Active Member of Clubs or Organizations:   . Attends Archivist Meetings:   Marland Kitchen Marital Status:   Intimate Partner Violence:   . Fear of Current or Ex-Partner:   . Emotionally Abused:   Marland Kitchen Physically Abused:   . Sexually Abused:     Past Surgical History:  Procedure Laterality Date  . I & D EXTREMITY  09/28/2012   Procedure: IRRIGATION AND DEBRIDEMENT EXTREMITY;  Surgeon: Roseanne Kaufman, MD;  Location: [facility];  Service: Orthopedics;  Laterality: Left;  . NERVE AND TENDON REPAIR  09/28/2012   Procedure: NERVE AND TENDON REPAIR;  Surgeon: Dominica Severin, MD;  Location: [facility];  Service: Orthopedics;  Laterality: Left;  . WISDOM TOOTH EXTRACTION  2008    Family History  Problem Relation Age of Onset  . Kidney Stones Father   . Hyperlipidemia Father   . Gout Father   . Thyroid disease Sister   . Cancer Maternal Aunt   . Thyroid disease Maternal Aunt   . Heart disease Maternal Grandfather   . Stroke Maternal Grandfather   . Kidney Stones Maternal Grandfather   . Heart disease Paternal Grandfather   . Kidney Stones Paternal Grandfather   . Gout Paternal Grandfather   . Leukemia Paternal Grandfather   . Hyperlipidemia Mother   . Gout Paternal Grandmother     Allergies  Allergen Reactions  . Bee Venom  Anaphylaxis    Current Outpatient Medications on File Prior to Visit  Medication Sig Dispense Refill  . albuterol (VENTOLIN HFA) 108 (90 Base) MCG/ACT inhaler Inhale 2 puffs into the lungs every 6 (six) hours as needed for wheezing or shortness of breath. 18 g 1  . citalopram (CELEXA) 20 MG tablet Take 1 tablet (20 mg total) by mouth daily. For anxiety and depression. 30 tablet 1  . diphenhydrAMINE (BENADRYL) 25 MG tablet Take 50 mg by mouth every 6 (six) hours as needed.    Marland Kitchen EPINEPHrine (AUVI-Q) 0.3 mg/0.3 mL IJ SOAJ injection Inject 0.3 mLs (0.3 mg total) into the muscle as needed for anaphylaxis. 1 each 0  . ibuprofen (ADVIL,MOTRIN) 200 MG tablet Take 400 mg by mouth every 6 (six) hours as needed. For pain    . ondansetron (ZOFRAN ODT) 4 MG disintegrating tablet Take 1 tablet (4 mg total) by mouth every 8 (eight) hours as needed for nausea or vomiting. 12 tablet 0   No current facility-administered medications on file prior to visit.    BP 110/66   Pulse 74   Temp (!) 95.9 F (35.5 C) (Temporal)   Ht 5\' 8"  (1.727 m)   Wt 180 lb 8 oz (81.9 kg)   SpO2 98%   BMI 27.44 kg/m    Objective:   Physical Exam  Respiratory: Effort normal.  Skin: Skin is warm and dry.  Mild rash to upper extremities bilaterally, raised with erythema, small vesicles. Representative of poison ivy dermatitis.            Assessment & Plan:

## 2020-04-11 ENCOUNTER — Telehealth (INDEPENDENT_AMBULATORY_CARE_PROVIDER_SITE_OTHER): Payer: No Typology Code available for payment source | Admitting: Primary Care

## 2020-04-11 DIAGNOSIS — F331 Major depressive disorder, recurrent, moderate: Secondary | ICD-10-CM | POA: Diagnosis not present

## 2020-04-11 DIAGNOSIS — L237 Allergic contact dermatitis due to plants, except food: Secondary | ICD-10-CM | POA: Diagnosis not present

## 2020-04-11 DIAGNOSIS — F411 Generalized anxiety disorder: Secondary | ICD-10-CM | POA: Diagnosis not present

## 2020-04-11 MED ORDER — CITALOPRAM HYDROBROMIDE 20 MG PO TABS: 20.0000 mg | ORAL_TABLET | Freq: Every day | ORAL | 1 refills | Status: DC

## 2020-04-11 NOTE — Patient Instructions (Signed)
Continue citalopram 20 mg daily for anxiety and depression.  It was a pleasure to see you today! Mayra Reel, NP-C

## 2020-04-11 NOTE — Progress Notes (Signed)
Subjective:    Patient ID: Clifford Hall, male    DOB: Oct 14, 1986, 34 y.o.   MRN: [id number]  HPI  Virtual Visit via Video Note  I connected with Clifford Hall on 04/11/20 at  7:20 AM EDT by a video enabled telemedicine application and verified that I am speaking with the correct person using two identifiers.  Location: Patient: Home Provider: Office   I discussed the limitations of evaluation and management by telemedicine and the availability of in person appointments. The patient expressed understanding and agreed to proceed.  History of Present Illness:  [name] is a 34 year old male who presents today for follow up of anxiety and depression.  He was originally evaluated on 02/29/20 for his CPE when he mentioned symptoms of difficulty controlling emotions, worry, feeling down, anger outbursts, etc. GAD 7 score of 18 and PHQ 9 score of 17 so we initiated treatment with citalopram 20 mg.  Since his last visit he's doing better. Positive effects include the ability to control his emotions, not throwing objects, ability to walk away and calm down. He's met with his family to discuss his symptoms and this meeting was very productive, he's feeling more support. Overall he's improving and would like to continue citalopram 20 mg daily. No negative effects.    Observations/Objective:  Alert and oriented. Appears well, not sickly. No distress. Speaking in complete sentences.  Assessment and Plan:  Much improved and continues to improve with citalopram 20 mg.  We will plan to continue citalopram for at least another 6 months with plans to touch base at that point.  Refills sent to pharmacy.  Follow Up Instructions:  Continue citalopram 20 mg daily for anxiety and depression.  It was a pleasure to see you today! Clifford Bossier, NP-C    I discussed the assessment and treatment plan with the patient. The patient was provided an opportunity to ask  questions and all were answered. The patient agreed with the plan and demonstrated an understanding of the instructions.   The patient was advised to call back or seek an in-person evaluation if the symptoms worsen or if the condition fails to improve as anticipated.    Clifford [name], NP    Review of Systems  Gastrointestinal: Negative for nausea.  Neurological: Negative for headaches.  Psychiatric/Behavioral: Negative for suicidal ideas.       See HPI       Past Medical History:  Diagnosis Date  . Asthma   . History of asthma   . History of chickenpox   . History of headache   . Hymenoptera allergy      Social History   Socioeconomic History  . Marital status: Married    Spouse name: Not on file  . Number of children: Not on file  . Years of education: Not on file  . Highest education level: Not on file  Occupational History  . Not on file  Tobacco Use  . Smoking status: Never Smoker  . Smokeless tobacco: Never Used  Vaping Use  . Vaping Use: Never used  Substance and Sexual Activity  . Alcohol use: Yes    Comment: Occationally - once or twice monthly  . Drug use: No  . Sexual activity: Not on file  Other Topics Concern  . Not on file  Social History Narrative   Married.   3 children, 1 child on the way.   Works in Tax adviser.    Enjoys  playing video games, spending time with family.   Social Determinants of Health   Financial Resource Strain:   . Difficulty of Paying Living Expenses:   Food Insecurity:   . Worried About Charity fundraiser in the Last Year:   . Arboriculturist in the Last Year:   Transportation Needs:   . Film/video editor (Medical):   Marland Kitchen Lack of Transportation (Non-Medical):   Physical Activity:   . Days of Exercise per Week:   . Minutes of Exercise per Session:   Stress:   . Feeling of Stress :   Social Connections:   . Frequency of Communication with Friends and Family:   . Frequency of Social Gatherings with  Friends and Family:   . Attends Religious Services:   . Active Member of Clubs or Organizations:   . Attends Archivist Meetings:   Marland Kitchen Marital Status:   Intimate Partner Violence:   . Fear of Current or Ex-Partner:   . Emotionally Abused:   Marland Kitchen Physically Abused:   . Sexually Abused:     Past Surgical History:  Procedure Laterality Date  . I & D EXTREMITY  09/28/2012   Procedure: IRRIGATION AND DEBRIDEMENT EXTREMITY;  Surgeon: Clifford Kaufman, MD;  Location: [facility];  Service: Orthopedics;  Laterality: Left;  . NERVE AND TENDON REPAIR  09/28/2012   Procedure: NERVE AND TENDON REPAIR;  Surgeon: Clifford Kaufman, MD;  Location: [facility];  Service: Orthopedics;  Laterality: Left;  . WISDOM TOOTH EXTRACTION  2008    Family History  Problem Relation Age of Onset  . Kidney Stones Father   . Hyperlipidemia Father   . Gout Father   . Thyroid disease Sister   . Cancer Maternal Aunt   . Thyroid disease Maternal Aunt   . Heart disease Maternal Grandfather   . Stroke Maternal Grandfather   . Kidney Stones Maternal Grandfather   . Heart disease Paternal Grandfather   . Kidney Stones Paternal Grandfather   . Gout Paternal Grandfather   . Leukemia Paternal Grandfather   . Hyperlipidemia Mother   . Gout Paternal Grandmother     Allergies  Allergen Reactions  . Bee Venom Anaphylaxis    Current Outpatient Medications on File Prior to Visit  Medication Sig Dispense Refill  . albuterol (VENTOLIN HFA) 108 (90 Base) MCG/ACT inhaler Inhale 2 puffs into the lungs every 6 (six) hours as needed for wheezing or shortness of breath. 18 g 1  . diphenhydrAMINE (BENADRYL) 25 MG tablet Take 50 mg by mouth every 6 (six) hours as needed.    Marland Kitchen EPINEPHrine (AUVI-Q) 0.3 mg/0.3 mL IJ SOAJ injection Inject 0.3 mLs (0.3 mg total) into the muscle as needed for anaphylaxis. 1 each 0  . ibuprofen (ADVIL,MOTRIN) 200 MG tablet Take 400 mg by mouth every 6 (six) hours as needed. For pain    . ondansetron (ZOFRAN  ODT) 4 MG disintegrating tablet Take 1 tablet (4 mg total) by mouth every 8 (eight) hours as needed for nausea or vomiting. 12 tablet 0  . predniSONE (DELTASONE) 20 MG tablet Take 2 tablets by mouth daily for four days, then 1 tablet daily for four days. 12 tablet 0   No current facility-administered medications on file prior to visit.    There were no vitals taken for this visit.   Objective:   Physical Exam  Constitutional: He is oriented to person, place, and time.  Respiratory: Effort normal.  GI: Normal appearance.  Neurological: He is  alert and oriented to person, place, and time.  Psychiatric: Mood normal.           Assessment & Plan:

## 2020-04-11 NOTE — Assessment & Plan Note (Signed)
Much improved and continues to improve with citalopram 20 mg.  We will plan to continue citalopram for at least another 6 months with plans to touch base at that point.  Refills sent to pharmacy.

## 2020-04-11 NOTE — Assessment & Plan Note (Signed)
Resolved

## 2020-04-11 NOTE — Assessment & Plan Note (Signed)
Much improved and continues to improve with citalopram 20 mg.  We will plan to continue citalopram for at least another 6 months with plans to touch base at that point.  Refills sent to pharmacy. 

## 2020-06-12 DIAGNOSIS — F411 Generalized anxiety disorder: Secondary | ICD-10-CM

## 2020-06-12 DIAGNOSIS — F331 Major depressive disorder, recurrent, moderate: Secondary | ICD-10-CM

## 2020-06-14 MED ORDER — CITALOPRAM HYDROBROMIDE 40 MG PO TABS: 40.0000 mg | ORAL_TABLET | Freq: Every day | ORAL | 0 refills | Status: DC

## 2020-08-14 ENCOUNTER — Ambulatory Visit: Payer: No Typology Code available for payment source | Admitting: Internal Medicine

## 2020-08-14 ENCOUNTER — Telehealth: Payer: No Typology Code available for payment source | Admitting: Nurse Practitioner

## 2020-08-14 DIAGNOSIS — L247 Irritant contact dermatitis due to plants, except food: Secondary | ICD-10-CM | POA: Diagnosis not present

## 2020-08-14 MED ORDER — PREDNISONE 10 MG PO TABS: ORAL_TABLET | ORAL | 0 refills | Status: DC

## 2020-08-14 NOTE — Progress Notes (Deleted)
Subjective:    Patient ID: Clifford Hall, male    DOB: 02-Dec-1985, 34 y.o.   MRN: [id number]  HPI  Pt presents to the clinic today with c/o a rash. He noticed this.  Review of Systems      Past Medical History:  Diagnosis Date  . Asthma   . History of asthma   . History of chickenpox   . History of headache   . Hymenoptera allergy     Current Outpatient Medications  Medication Sig Dispense Refill  . albuterol (VENTOLIN HFA) 108 (90 Base) MCG/ACT inhaler Inhale 2 puffs into the lungs every 6 (six) hours as needed for wheezing or shortness of breath. 18 g 1  . citalopram (CELEXA) 40 MG tablet Take 1 tablet (40 mg total) by mouth daily. For anxiety and depression. 90 tablet 0  . diphenhydrAMINE (BENADRYL) 25 MG tablet Take 50 mg by mouth every 6 (six) hours as needed.    Marland Kitchen EPINEPHrine (AUVI-Q) 0.3 mg/0.3 mL IJ SOAJ injection Inject 0.3 mLs (0.3 mg total) into the muscle as needed for anaphylaxis. 1 each 0  . ibuprofen (ADVIL,MOTRIN) 200 MG tablet Take 400 mg by mouth every 6 (six) hours as needed. For pain    . ondansetron (ZOFRAN ODT) 4 MG disintegrating tablet Take 1 tablet (4 mg total) by mouth every 8 (eight) hours as needed for nausea or vomiting. 12 tablet 0  . predniSONE (DELTASONE) 20 MG tablet Take 2 tablets by mouth daily for four days, then 1 tablet daily for four days. 12 tablet 0   No current facility-administered medications for this visit.    Allergies  Allergen Reactions  . Bee Venom Anaphylaxis    Family History  Problem Relation Age of Onset  . Kidney Stones Father   . Hyperlipidemia Father   . Gout Father   . Thyroid disease Sister   . Cancer Maternal Aunt   . Thyroid disease Maternal Aunt   . Heart disease Maternal Grandfather   . Stroke Maternal Grandfather   . Kidney Stones Maternal Grandfather   . Heart disease Paternal Grandfather   . Kidney Stones Paternal Grandfather   . Gout Paternal Grandfather   . Leukemia Paternal  Grandfather   . Hyperlipidemia Mother   . Gout Paternal Grandmother     Social History   Socioeconomic History  . Marital status: Married    Spouse name: Not on file  . Number of children: Not on file  . Years of education: Not on file  . Highest education level: Not on file  Occupational History  . Not on file  Tobacco Use  . Smoking status: Never Smoker  . Smokeless tobacco: Never Used  Vaping Use  . Vaping Use: Never used  Substance and Sexual Activity  . Alcohol use: Yes    Comment: Occationally - once or twice monthly  . Drug use: No  . Sexual activity: Not on file  Other Topics Concern  . Not on file  Social History Narrative   Married.   3 children, 1 child on the way.   Works in Radiation protection practitioner.    Enjoys playing video games, spending time with family.   Social Determinants of Health   Financial Resource Strain:   . Difficulty of Paying Living Expenses: Not on file  Food Insecurity:   . Worried About Programme researcher, broadcasting/film/video in the Last Year: Not on file  . Ran Out of Food in the Last Year: Not on  file  Transportation Needs:   . Freight forwarder (Medical): Not on file  . Lack of Transportation (Non-Medical): Not on file  Physical Activity:   . Days of Exercise per Week: Not on file  . Minutes of Exercise per Session: Not on file  Stress:   . Feeling of Stress : Not on file  Social Connections:   . Frequency of Communication with Friends and Family: Not on file  . Frequency of Social Gatherings with Friends and Family: Not on file  . Attends Religious Services: Not on file  . Active Member of Clubs or Organizations: Not on file  . Attends Banker Meetings: Not on file  . Marital Status: Not on file  Intimate Partner Violence:   . Fear of Current or Ex-Partner: Not on file  . Emotionally Abused: Not on file  . Physically Abused: Not on file  . Sexually Abused: Not on file     Constitutional: Denies fever, malaise, fatigue, headache  or abrupt weight changes.  HEENT: Denies eye pain, eye redness, ear pain, ringing in the ears, wax buildup, runny nose, nasal congestion, bloody nose, or sore throat. Respiratory: Denies difficulty breathing, shortness of breath, cough or sputum production.   Cardiovascular: Denies chest pain, chest tightness, palpitations or swelling in the hands or feet.  Gastrointestinal: Denies abdominal pain, bloating, constipation, diarrhea or blood in the stool.  GU: Denies urgency, frequency, pain with urination, burning sensation, blood in urine, odor or discharge. Musculoskeletal: Denies decrease in range of motion, difficulty with gait, muscle pain or joint pain and swelling.  Skin: Pt reports rash. Denies ulcercations.  Neurological: Denies dizziness, difficulty with memory, difficulty with speech or problems with balance and coordination.  Psych: Denies anxiety, depression, SI/HI.  No other specific complaints in a complete review of systems (except as listed in HPI above).  Objective:   Physical Exam   There were no vitals taken for this visit. Wt Readings from Last 3 Encounters:  03/30/20 180 lb 8 oz (81.9 kg)  02/29/20 182 lb 12 oz (82.9 kg)  01/30/20 182 lb 8 oz (82.8 kg)    General: Appears their stated age, well developed, well nourished in NAD. Skin: Warm, dry and intact. No rashes, lesions or ulcerations noted. HEENT: Head: normal shape and size; Eyes: sclera white, no icterus, conjunctiva pink, PERRLA and EOMs intact; Ears: Tm's gray and intact, normal light reflex; Nose: mucosa pink and moist, septum midline; Throat/Mouth: Teeth present, mucosa pink and moist, no exudate, lesions or ulcerations noted.  Neck:  Neck supple, trachea midline. No masses, lumps or thyromegaly present.  Cardiovascular: Normal rate and rhythm. S1,S2 noted.  No murmur, rubs or gallops noted. No JVD or BLE edema. No carotid bruits noted. Pulmonary/Chest: Normal effort and positive vesicular breath sounds. No  respiratory distress. No wheezes, rales or ronchi noted.  Abdomen: Soft and nontender. Normal bowel sounds. No distention or masses noted. Liver, spleen and kidneys non palpable. Musculoskeletal: Normal range of motion. No signs of joint swelling. No difficulty with gait.  Neurological: Alert and oriented. Cranial nerves II-XII grossly intact. Coordination normal.  Psychiatric: Mood and affect normal. Behavior is normal. Judgment and thought content normal.   EKG:  BMET    Component Value Date/Time   NA 139 02/23/2020 0802   K 4.3 02/23/2020 0802   CL 105 02/23/2020 0802   CO2 27 02/23/2020 0802   GLUCOSE 96 02/23/2020 0802   BUN 15 02/23/2020 0802   CREATININE 0.89 02/23/2020  0802   CALCIUM 10.0 02/23/2020 0802   GFRNONAA >60 11/12/2015 2117   GFRAA >60 11/12/2015 2117    Lipid Panel     Component Value Date/Time   CHOL 222 (H) 02/23/2020 0802   TRIG 127.0 02/23/2020 0802   HDL 49.40 02/23/2020 0802   CHOLHDL 4 02/23/2020 0802   VLDL 25.4 02/23/2020 0802   LDLCALC 147 (H) 02/23/2020 0802    CBC    Component Value Date/Time   WBC 5.8 02/23/2020 0802   RBC 4.74 02/23/2020 0802   HGB 13.8 02/23/2020 0802   HCT 41.0 02/23/2020 0802   PLT 353.0 02/23/2020 0802   MCV 86.5 02/23/2020 0802   MCH 27.9 11/12/2015 2117   MCHC 33.8 02/23/2020 0802   RDW 14.4 02/23/2020 0802   LYMPHSABS 1.5 11/12/2015 2117   MONOABS 1.0 11/12/2015 2117   EOSABS 0.0 11/12/2015 2117   BASOSABS 0.0 11/12/2015 2117    Hgb A1C No results found for: HGBA1C         Assessment & Plan:    Nicki Reaper, NP This visit occurred during the SARS-CoV-2 public health emergency.  Safety protocols were in place, including screening questions prior to the visit, additional usage of staff PPE, and extensive cleaning of exam room while observing appropriate contact time as indicated for disinfecting solutions.

## 2020-08-14 NOTE — Progress Notes (Signed)

## 2020-08-16 ENCOUNTER — Ambulatory Visit: Payer: No Typology Code available for payment source | Admitting: Internal Medicine

## 2020-08-31 ENCOUNTER — Telehealth: Payer: No Typology Code available for payment source | Admitting: Nurse Practitioner

## 2020-08-31 DIAGNOSIS — H00014 Hordeolum externum left upper eyelid: Secondary | ICD-10-CM

## 2020-08-31 MED ORDER — NEOMYCIN-POLYMYXIN-HC 3.5-10000-1 OP SUSP: 3.0000 [drp] | Freq: Four times a day (QID) | OPHTHALMIC | 0 refills | Status: DC

## 2020-08-31 NOTE — Addendum Note (Signed)
Addended by: Bennie Pierini on: 08/31/2020 06:22 PM   Modules accepted: Orders

## 2020-08-31 NOTE — Progress Notes (Signed)
We are sorry that you are not feeling well. Here is how we plan to help! ° °Based on what you have shared with me it looks like you have a stye.  A stye is an inflammation of the eyelid.  It is often a red, painful lump near the edge of the eyelid that may look like a boil or a pimple.  A stye develops when an infection occurs at the base of an eyelash.  ° °We have made appropriate suggestions for you based upon your presentation: °Your symptoms may indicate an infection of the sclera.  The use of anti-inflammatory and antibiotic eye drops for a week will help resolve this condition.  I have sent in neomycin-polymyxin HC opthalmic suspension, two to three drops in the affected eye every 4 hours.  If your symptoms do not improve over the next two to three days you should be seen in your doctor's office. ° °HOME CARE: ° °· Wash your hands often! °· Let the stye open on its own. Don't squeeze or open it. °· Don't rub your eyes. This can irritate your eyes and let in bacteria.  If you need to touch your eyes, wash your hands first. °· Don't wear eye makeup or contact lenses until the area has healed. ° °GET HELP RIGHT AWAY IF: ° °· Your symptoms do not improve. °· You develop blurred or loss of vision. °· Your symptoms worsen (increased discharge, pain or redness). ° °Thank you for choosing an e-visit. ° °Your e-visit answers were reviewed by a board certified advanced clinical practitioner to complete your personal care plan.  Depending upon the condition, your plan could have included both over the counter or prescription medications. ° °Please review your pharmacy choice.  Make sure the pharmacy is open so you can pick up prescription now.  If there is a problem, you may contact your provider through MyChart messaging and have the prescription routed to another pharmacy.   ° °Your safety is important to us.  If you have drug allergies check your prescription carefully. ° °For the next 24 hours you can use MyChart to  ask questions about today's visit, request a non-urgent call back, or ask for a work or school excuse. ° °You will get an email in the next two days asking about your experience.  I hope you that your e-visit has been valuable and will speed your recovery. ° °5-10 minutes spent reviewing and documenting in chart. ° °

## 2020-09-01 MED ORDER — ERYTHROMYCIN 5 MG/GM OP OINT: 1.0000 "application " | TOPICAL_OINTMENT | Freq: Every day | OPHTHALMIC | 0 refills | Status: DC

## 2020-09-01 NOTE — Addendum Note (Signed)
Addended by: Liberty Handy on: 09/01/2020 09:19 AM   Modules accepted: Orders

## 2020-09-12 ENCOUNTER — Ambulatory Visit: Payer: Self-pay | Admitting: Primary Care

## 2020-10-30 DIAGNOSIS — F411 Generalized anxiety disorder: Secondary | ICD-10-CM

## 2020-10-30 DIAGNOSIS — F331 Major depressive disorder, recurrent, moderate: Secondary | ICD-10-CM

## 2020-10-30 MED ORDER — CITALOPRAM HYDROBROMIDE 40 MG PO TABS: 40.0000 mg | ORAL_TABLET | Freq: Every day | ORAL | 1 refills | Status: DC

## 2020-11-21 ENCOUNTER — Ambulatory Visit (INDEPENDENT_AMBULATORY_CARE_PROVIDER_SITE_OTHER): Payer: No Typology Code available for payment source

## 2020-11-21 ENCOUNTER — Other Ambulatory Visit: Payer: Self-pay

## 2020-11-21 ENCOUNTER — Telehealth: Payer: Self-pay

## 2020-11-21 ENCOUNTER — Encounter: Payer: Self-pay | Admitting: Emergency Medicine

## 2020-11-21 ENCOUNTER — Ambulatory Visit
Admission: EM | Admit: 2020-11-21 | Discharge: 2020-11-21 | Disposition: A | Discharge status: Home / Self Care | Payer: No Typology Code available for payment source | Attending: Sports Medicine | Admitting: Sports Medicine

## 2020-11-21 DIAGNOSIS — J069 Acute upper respiratory infection, unspecified: Secondary | ICD-10-CM | POA: Diagnosis not present

## 2020-11-21 DIAGNOSIS — G9331 Postviral fatigue syndrome: Secondary | ICD-10-CM

## 2020-11-21 DIAGNOSIS — R059 Cough, unspecified: Secondary | ICD-10-CM | POA: Diagnosis not present

## 2020-11-21 DIAGNOSIS — J40 Bronchitis, not specified as acute or chronic: Secondary | ICD-10-CM | POA: Diagnosis not present

## 2020-11-21 DIAGNOSIS — U071 COVID-19: Secondary | ICD-10-CM

## 2020-11-21 DIAGNOSIS — G933 Postviral fatigue syndrome: Secondary | ICD-10-CM

## 2020-11-21 MED ORDER — BENZONATATE 100 MG PO CAPS: 100.0000 mg | ORAL_CAPSULE | Freq: Three times a day (TID) | ORAL | 0 refills | Status: DC

## 2020-11-21 MED ORDER — AZITHROMYCIN 250 MG PO TABS: 250.0000 mg | ORAL_TABLET | Freq: Every day | ORAL | 0 refills | Status: DC

## 2020-11-21 NOTE — Telephone Encounter (Signed)
Noted, patient evaluated at [facility]. Note reviewed.

## 2020-11-21 NOTE — ED Provider Notes (Signed)
[facility]    CSN: [id number] Arrival date & time: 11/21/20  5427      History   Chief Complaint Chief Complaint  Patient presents with  . Cough  . Fatigue    HPI Clifford Hall is a 35 y.o. male.   Patient is a pleasant 35 year old male who presents for evaluation of cough and COVID-like symptoms.  Please see nursing note above.  He said that he began to be symptomatic on January 16 with cough congestion and drainage.  He did a home test the next day which was negative but on 18 January was more symptomatic and did a home test that was positive.  He said that his symptoms were quite bad for about 4 days with fevers chills fatigue and congestion and mild shortness of breath as well as some mild chest tightness.  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  Since he wanted to go back to work he retested himself on 27 January and it was negative.  He did quarantine until 30 January.  His concern is that he has a persistent cough that is nonproductive and some mild chest tightness and mild shortness of breath that returned several days ago.  He continues to be fatigued.  No fever shakes chills.  He has been against against Covid and influenza.  No red flag signs or symptoms elicited on history.     Past Medical History:  Diagnosis Date  . Asthma   . History of asthma   . History of chickenpox   . History of headache   . Hymenoptera allergy     Patient Active Problem List   Diagnosis Date Noted  . Poison ivy dermatitis 03/30/2020  . Hyperlipidemia 02/29/2020  . MDD (major depressive disorder) 02/29/2020  . GAD (generalized anxiety disorder) 02/29/2020  . 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 Fatigue 07/04/2019  . Preventative health care 11/23/2018  . Mild intermittent asthma/exercise-induced bronchospasm 11/03/2018  . History of motion sickness 11/03/2018  . Dyspnea/wheezing 04/15/2016  . Hymenoptera  venom allergy 06/30/2015    Past Surgical History:  Procedure Laterality Date  . I & D EXTREMITY  09/28/2012   Procedure: IRRIGATION AND DEBRIDEMENT EXTREMITY;  Surgeon: Dominica Severin, MD;  Location: [facility] OR;  Service: Orthopedics;  Laterality: Left;  . NERVE AND TENDON REPAIR  09/28/2012   Procedure: NERVE AND TENDON REPAIR;  Surgeon: Dominica Severin, MD;  Location: [facility] OR;  Service: Orthopedics;  Laterality: Left;  . WISDOM TOOTH EXTRACTION  2008       Home Medications    Prior to Admission medications   Medication Sig Start Date End Date Taking? Authorizing Provider  albuterol (VENTOLIN HFA) 108 (90 Base) MCG/ACT inhaler Inhale 2 puffs into the lungs every 6 (six) hours as needed for wheezing or shortness of breath. 01/30/20  Yes Bobbitt, Heywood Iles, MD  azithromycin (ZITHROMAX) 250 MG tablet Take 1 tablet (250 mg total) by mouth daily. Take first 2 tablets together, then 1 every day until finished. 11/21/20  Yes Delton See, MD  benzonatate (TESSALON) 100 MG capsule Take 1 capsule (100 mg total) by mouth every 8 (eight) hours. 11/21/20  Yes Delton See, MD  citalopram (CELEXA) 40 MG tablet Take 1 tablet (40 mg total) by mouth daily. For anxiety and depression. 10/30/20  Yes Doreene Nest, NP  EPINEPHrine (AUVI-Q) 0.3 mg/0.3 mL IJ SOAJ injection Inject 0.3 mLs (0.3 mg total) into the muscle  as needed for anaphylaxis. 01/30/20  Yes Bobbitt, Heywood Iles, MD  ibuprofen (ADVIL,MOTRIN) 200 MG tablet Take 400 mg by mouth every 6 (six) hours as needed. For pain   Yes [provider]  diphenhydrAMINE (BENADRYL) 25 MG tablet Take 50 mg by mouth every 6 (six) hours as needed.    [provider]  erythromycin ophthalmic ointment Place 1 application into the right eye at bedtime. 09/01/20   Liberty Handy, PA-C  neomycin-polymyxin-hydrocortisone (CORTISPORIN) 3.5-10000-1 ophthalmic suspension Place 3 drops into both eyes 4 (four) times daily. 08/31/20   Daphine Deutscher,  Mary-Margaret, FNP  ondansetron (ZOFRAN ODT) 4 MG disintegrating tablet Take 1 tablet (4 mg total) by mouth every 8 (eight) hours as needed for nausea or vomiting. 12/22/19   Doreene Nest, NP  predniSONE (DELTASONE) 10 MG tablet Days 1-4 take 4 tablets (40 mg) daily  Days 5-8 take 3 tablets (30 mg) daily, Days 9-11 take 2 tablets (20 mg) daily, Days 12-14 take 1 tablet (10 mg) daily. 08/14/20   Bennie Pierini, FNP    Family History Family History  Problem Relation Age of Onset  . Kidney Stones Father   . Hyperlipidemia Father   . Gout Father   . Thyroid disease Sister   . Cancer Maternal Aunt   . Thyroid disease Maternal Aunt   . Heart disease Maternal Grandfather   . Stroke Maternal Grandfather   . Kidney Stones Maternal Grandfather   . Heart disease Paternal Grandfather   . Kidney Stones Paternal Grandfather   . Gout Paternal Grandfather   . Leukemia Paternal Grandfather   . Hyperlipidemia Mother   . Gout Paternal Grandmother     Social History Social History   Tobacco Use  . Smoking status: Never Smoker  . Smokeless tobacco: Never Used  Vaping Use  . Vaping Use: Never used  Substance Use Topics  . Alcohol use: Yes    Comment: Occationally - once or twice monthly  . Drug use: No     Allergies   Bee venom   Review of Systems Review of Systems  Constitutional: Positive for fatigue. Negative for chills and fever.  HENT: Negative for congestion, ear pain, rhinorrhea, sinus pressure, sinus pain and sore throat.   Eyes: Negative for pain.  Respiratory: Positive for cough, chest tightness and shortness of breath. Negative for apnea, wheezing and stridor.   Cardiovascular: Negative for chest pain, palpitations and leg swelling.  Gastrointestinal: Negative for abdominal pain.  Genitourinary: Negative for dysuria.  Musculoskeletal: Negative for myalgias.  Skin: Negative for color change, pallor, rash and wound.  Neurological: Negative for dizziness, syncope,  light-headedness and headaches.  All other systems reviewed and are negative.    Physical Exam Triage Vital Signs ED Triage Vitals [11/21/20 1003]  Enc Vitals Group     BP 132/80     Pulse Rate 88     Resp 18     Temp 98.3 F (36.8 C)     Temp Source Oral     SpO2 99 %     Weight 180 lb 8.9 oz (81.9 kg)     Height 5\' 8"  (1.727 m)     Head Circumference      Peak Flow      Pain Score 0     Pain Loc      Pain Edu?      Excl. in GC?    No data found.  Updated Vital Signs BP 132/80 (BP Location: Right Arm)  Pulse 88   Temp 98.3 F (36.8 C) (Oral)   Resp 18   Ht 5\' 8"  (1.727 m)   Wt 81.9 kg   SpO2 99%   BMI 27.45 kg/m   Visual Acuity Right Eye Distance:   Left Eye Distance:   Bilateral Distance:    Right Eye Near:   Left Eye Near:    Bilateral Near:     Physical Exam Vitals and nursing note reviewed.  Constitutional:      General: He is not in acute distress.    Appearance: He is not ill-appearing or toxic-appearing.  HENT:     Head: Normocephalic and atraumatic.     Right Ear: Tympanic membrane normal.     Left Ear: Tympanic membrane normal.     Nose: Nose normal. No congestion or rhinorrhea.     Mouth/Throat:     Mouth: Mucous membranes are moist.     Pharynx: Posterior oropharyngeal erythema present. No oropharyngeal exudate.  Eyes:     Extraocular Movements: Extraocular movements intact.     Conjunctiva/sclera: Conjunctivae normal.     Pupils: Pupils are equal, round, and reactive to light.  Cardiovascular:     Rate and Rhythm: Normal rate and regular rhythm.     Pulses: Normal pulses.     Heart sounds: Normal heart sounds. No murmur heard. No friction rub. No gallop.   Pulmonary:     Effort: Pulmonary effort is normal. No respiratory distress.     Breath sounds: Normal breath sounds. No stridor. No wheezing, rhonchi or rales.     Comments: Patient does have an induced cough with deep inspiration on auscultation. Musculoskeletal:     Cervical  back: Normal range of motion and neck supple. No rigidity.  Lymphadenopathy:     Cervical: Cervical adenopathy present.  Skin:    General: Skin is warm and dry.     Capillary Refill: Capillary refill takes less than 2 seconds.  Neurological:     General: No focal deficit present.     Mental Status: He is alert and oriented to person, place, and time.  Psychiatric:        Mood and Affect: Mood normal.        Behavior: Behavior normal.      UC Treatments / Results  Labs (all labs ordered are listed, but only abnormal results are displayed) Labs Reviewed - No data to display  EKG   Radiology DG Chest 2 View  Result Date: 11/21/2020 CLINICAL DATA:  COVID positive. EXAM: CHEST - 2 VIEW COMPARISON:  No prior. FINDINGS: Mediastinum hilar structures normal. Mild peribronchial cuffing. Mild bronchitis cannot be excluded. No focal infiltrate. No pleural effusion or pneumothorax. Mild biapical pleural thickening consistent with scarring. IMPRESSION: Mild peribronchial cuffing suggesting bronchitis. Electronically Signed   By: 01/19/2021  Register   On: 11/21/2020 10:18    Procedures Procedures (including critical care time)  Medications Ordered in UC Medications - No data to display  Initial Impression / Assessment and Plan / UC Course  I have reviewed the triage vital signs and the nursing notes.  Pertinent labs & imaging results that were available during my care of the patient were reviewed by me and considered in my medical decision making (see chart for details).  Clinical impression: COVID-like symptoms for greater than 2 weeks with a positive home test.  Patient has persistent cough and significant fatigue consistent with potential bronchitis and post viral fatigue syndrome.  Treatment plan: 1.  The findings and  treatment plan were discussed in detail with the patient.  Patient was in agreement. 2.  Recommended getting a chest x-ray.  Results are above.  Consistent with possible  bronchitis.  Will treat with a Z-Pak.  We will also give him Tessalon Perles for his cough. 3.  I had a long discussion with him regarding post viral fatigue syndrome.  I indicated his fatigue could linger for quite a while especially after a positive Covid test.  We discussed repeating it but we both agree that was not necessary today. 4.  Work note was not necessary as he works for himself. 5.  Over-the-counter meds as needed, Tylenol or Motrin for fever discomfort.  Plenty of rest and plenty of fluids. 6.  Educational handouts were provided. 7.  Follow-up here as needed.     Final Clinical Impressions(s) / UC Diagnoses   Final diagnoses:  Bronchitis  Cough  Viral URI with cough  Post viral syndrome     Discharge Instructions     Your chest x-ray showed the possibility of bronchitis.  We will treat you with a Z-Pak.  Has been sent to your pharmacy. Please see your educational handouts. Over-the-counter meds as needed, plenty of rest, plenty of fluids, Tylenol or Motrin for fever or discomfort.  Just listen to your body in terms of the fatigue.  If you have any further problems, you are welcome to come back and see [place].  If your fatigue persists then you should see your primary care physician to rule out a secondary cause of your fatigue.  I hope you get to feeling better, Dr. Zachery Dauer    ED Prescriptions    Medication Sig Dispense Auth. Provider   azithromycin (ZITHROMAX) 250 MG tablet Take 1 tablet (250 mg total) by mouth daily. Take first 2 tablets together, then 1 every day until finished. 6 tablet Delton See, MD   benzonatate (TESSALON) 100 MG capsule Take 1 capsule (100 mg total) by mouth every 8 (eight) hours. 21 capsule Delton See, MD     PDMP not reviewed this encounter.   Delton See, MD 11/21/20 1048

## 2020-11-21 NOTE — Telephone Encounter (Signed)
Please see note below the access nurse note; pt refused ED disposition but is going to [facility] in [place].

## 2020-11-21 NOTE — Telephone Encounter (Signed)
Access nurse spoke with Bernette Redbird, front office mgr. Pt had positive covid 11/06/20 and neg covid on 11/15/20. Presently pt has cough,heaviness in chest,SOB at rest and exertion and extreme fatigue. Pt was advised to go to ED and declined and wanted to be seen at [facility]. I spoke with pt; pt has been SOB on and off for 2 wks; presently SOB at rest; can hear pt's SOB now; pt said feels like someone is sitting on chest; pt has dry cough; no fever now. Pt does not want to sit in ED for hours but pt said he would go to [facility] in [place]. Pt declined 911 and said he would drive himself; advised pt with SOB and heaviness in chest should not drive. Pt voiced understanding and sending note to Allayne Gitelman NP and Joellen CMA.

## 2020-11-21 NOTE — Telephone Encounter (Signed)
[facility] Night - Client TELEPHONE ADVICE RECORD AccessNurse Patient Name: Clifford Hall [place] Gender: Male DOB: 1986-01-23 Age: 35 Y 9 M 4 D Return Phone Number: [phone] (Primary), [phone] (Secondary) Address: City/State/Zip: [place] [address] Client [facility] Night - Client Client Site [facility] - Night Physician Vernona Rieger - NP Contact Type Call Who Is Calling Patient / Member / Family / Caregiver Call Type Triage / Clinical Relationship To Patient Self Return Phone Number [phone] (Primary) Chief Complaint BREATHING - shortness of breath or sounds breathless Reason for Call Symptomatic / Request for Health Information Initial Comment Caller began sxs 1/16 and dx Covid 1/18 and quarantined until 1/30. Tested neg 1/27. He now has cough again, chest heaviness, SOB. Fatigued. Audible, deep, wheezing cough. Additional Comment Adds that he wants wife to have total access to his medical information Translation No Nurse Assessment Nurse: Lynwood Dawley, RN, [place] Date/Time (Eastern Time): 11/21/2020 8:05:28 AM Confirm and document reason for call. If symptomatic, describe symptoms. ---caller states covid-19 positive on 11/06/20. negative on 11/15/20. cough again. chest heaviness. SOB on and off with exertion and rest Does the patient have any new or worsening symptoms? ---Yes Will a triage be completed? ---Yes Related visit to physician within the last 2 weeks? ---No Does the PT have any chronic conditions? (i.e. diabetes, asthma, this includes High risk factors for pregnancy, etc.) ---Yes List chronic conditions. ---allergies Is this a behavioral health or substance abuse call? ---No Guidelines Guideline Title Affirmed Question Affirmed Notes Nurse Date/Time (Eastern Time) COVID-19 - Diagnosed or Suspected SEVERE or constant chest pain or pressure (Exception: Mild  central chest pain, present only when coughing.) Lynwood Dawley, RN, Kimber[place] 11/21/2020 8:14:02 AM Disp. Time Lamount Cohen Time) Disposition Final User 11/21/2020 8:03:08 AM Send to Urgent Queue [place], Erin PLEASE NOTE: All timestamps contained within this report are represented as [place] Standard Time. CONFIDENTIALTY NOTICE: This fax transmission is intended only for the addressee. It contains information that is legally privileged, confidential or otherwise protected from use or disclosure. If you are not the intended recipient, you are strictly prohibited from reviewing, disclosing, copying using or disseminating any of this information or taking any action in reliance on or regarding this information. If you have received this fax in error, please notify us immediately by telephone so that we can arrange for its return to [place]. Phone: [phone], Toll-Free: [phone], Fax: [phone] Page: 2 of 2 Call Id: [id number] 11/21/2020 8:21:48 AM Go to ED Now Yes Lynwood Dawley, RN, [place]cepc[place] Disagree/Comply Disagree Caller Understands Yes PreDisposition Call Doctor Care Advice Given Per Guideline GO TO ED NOW: * You need to be seen in the Emergency Department. ANOTHER ADULT SHOULD DRIVE: * It is better and safer if another adult drives instead of you. CALL EMS 911 IF: * Severe difficulty breathing occurs * Lips or face turns blue * Confusion occurs. CARE ADVICE given per COVID-19 - DIAGNOSED OR SUSPECTED (Adult) guideline. Comments User: Kristopher Oppenheim, RN Date/Time Lamount Cohen Time): 11/21/2020 8:23:51 AM caller states does not want to go to ER. would like to be evaluated at MD office. chest heaviness constant. severity varies User: Kristopher Oppenheim, RN Date/Time Lamount Cohen Time): 11/21/2020 8:29:29 AM office rep states will discuss with RN there and ask her to return the callers call. caller aware Referrals [facility] - ED

## 2020-11-21 NOTE — ED Triage Notes (Signed)
Patient was COVID positive on 01/18. He states Tuesday he started coughing again and having chest tightness. He also reports feeling fatigue. He is wanting to get a CXR.

## 2020-11-21 NOTE — Discharge Instructions (Addendum)
Your chest x-ray showed the possibility of bronchitis.  We will treat you with a Z-Pak.  Has been sent to your pharmacy. Please see your educational handouts. Over-the-counter meds as needed, plenty of rest, plenty of fluids, Tylenol or Motrin for fever or discomfort.  Just listen to your body in terms of the fatigue.  If you have any further problems, you are welcome to come back and see [place].  If your fatigue persists then you should see your primary care physician to rule out a secondary cause of your fatigue.  I hope you get to feeling better, Dr. Zachery Dauer

## 2021-03-03 ENCOUNTER — Telehealth: Payer: No Typology Code available for payment source | Admitting: Physician Assistant

## 2021-03-03 DIAGNOSIS — L03039 Cellulitis of unspecified toe: Secondary | ICD-10-CM

## 2021-03-03 NOTE — Progress Notes (Signed)
I have spent 5 minutes in review of e-visit questionnaire, review and updating patient chart, medical decision making and response to patient.   Harout Scheurich Cody Etrulia Zarr, PA-C    

## 2021-03-03 NOTE — Progress Notes (Signed)
E Visit for Cellulitis  We are sorry that you are not feeling well. Here is how we plan to help!  Based on what you shared with me it looks like you have cellulitis.  Cellulitis looks like areas of skin redness, swelling, and warmth; it develops as a result of bacteria entering under the skin. Little red spots and/or bleeding can be seen in skin, and tiny surface sacs containing fluid can occur. Fever can be present. Cellulitis is almost always on one side of a body, and the lower limbs are the most common site of involvement.   I have prescribed:  Keflex 500mg take one by mouth four times a day for 5 days  HOME CARE:  Take your medications as ordered and take all of them, even if the skin irritation appears to be healing.   GET HELP RIGHT AWAY IF:  Symptoms that don't begin to go away within 48 hours. Severe redness persists or worsens If the area turns color, spreads or swells. If it blisters and opens, develops yellow-brown crust or bleeds. You develop a fever or chills. If the pain increases or becomes unbearable.  Are unable to keep fluids and food down.  MAKE SURE YOU   Understand these instructions. Will watch your condition. Will get help right away if you are not doing well or get worse.  Thank you for choosing an e-visit.  Your e-visit answers were reviewed by a board certified advanced clinical practitioner to complete your personal care plan. Depending upon the condition, your plan could have included both over the counter or prescription medications.  Please review your pharmacy choice. Make sure the pharmacy is open so you can pick up prescription now. If there is a problem, you may contact your provider through MyChart messaging and have the prescription routed to another pharmacy.  Your safety is important to us. If you have drug allergies check your prescription carefully.   For the next 24 hours you can use MyChart to ask questions about today's visit, request a  non-urgent call back, or ask for a work or school excuse. You will get an email in the next two days asking about your experience. I hope that your e-visit has been valuable and will speed your recovery.  

## 2021-03-04 MED ORDER — CEPHALEXIN 500 MG PO CAPS: 500.0000 mg | ORAL_CAPSULE | Freq: Four times a day (QID) | ORAL | 0 refills | Status: AC

## 2021-03-04 NOTE — Addendum Note (Signed)
Addended by: Lurene Shadow on: 03/04/2021 10:43 AM   Modules accepted: Orders

## 2021-03-12 ENCOUNTER — Encounter: Payer: No Typology Code available for payment source | Admitting: Primary Care

## 2021-04-09 ENCOUNTER — Ambulatory Visit (INDEPENDENT_AMBULATORY_CARE_PROVIDER_SITE_OTHER): Payer: No Typology Code available for payment source | Admitting: Primary Care

## 2021-04-09 ENCOUNTER — Other Ambulatory Visit (HOSPITAL_COMMUNITY): Payer: Self-pay

## 2021-04-09 ENCOUNTER — Other Ambulatory Visit: Payer: Self-pay

## 2021-04-09 ENCOUNTER — Encounter: Payer: Self-pay | Admitting: Primary Care

## 2021-04-09 VITALS — BP 120/79 | HR 69 | Temp 97.8°F | Ht 68.0 in | Wt 202.0 lb

## 2021-04-09 DIAGNOSIS — F331 Major depressive disorder, recurrent, moderate: Secondary | ICD-10-CM

## 2021-04-09 DIAGNOSIS — Z0000 Encounter for general adult medical examination without abnormal findings: Secondary | ICD-10-CM | POA: Diagnosis not present

## 2021-04-09 DIAGNOSIS — J4520 Mild intermittent asthma, uncomplicated: Secondary | ICD-10-CM | POA: Diagnosis not present

## 2021-04-09 DIAGNOSIS — F411 Generalized anxiety disorder: Secondary | ICD-10-CM

## 2021-04-09 DIAGNOSIS — R519 Headache, unspecified: Secondary | ICD-10-CM

## 2021-04-09 DIAGNOSIS — R110 Nausea: Secondary | ICD-10-CM | POA: Diagnosis not present

## 2021-04-09 DIAGNOSIS — F5102 Adjustment insomnia: Secondary | ICD-10-CM

## 2021-04-09 MED ORDER — ONDANSETRON 4 MG PO TBDP
4.0000 mg | ORAL_TABLET | Freq: Once | ORAL | Status: AC
  Administered 2021-04-09: 4 mg via ORAL

## 2021-04-09 MED ORDER — ESCITALOPRAM OXALATE 10 MG PO TABS
10.0000 mg | ORAL_TABLET | Freq: Every day | ORAL | 0 refills | Status: DC
  Filled 2021-04-09: qty 90, 90d supply, fill #0

## 2021-04-09 NOTE — Assessment & Plan Note (Signed)
Immunizations UTD.  Discussed the importance of a healthy diet and regular exercise in order for weight loss, and to reduce the risk of further co-morbidity.  Exam today as noted labs pending.

## 2021-04-09 NOTE — Assessment & Plan Note (Signed)
Improved on increased dose of citalopram 40 mg, seems to be causing nightmares and erectile side effects.   Will switch to Lexapro 10 mg for now, can increase to 20 mg thereafter. He will update.

## 2021-04-09 NOTE — Assessment & Plan Note (Addendum)
Chronic for years, worse recently. Did not get a chance to discuss in great detail today. Will be working to improve GAD and MDD and eliminating side effects.   Treated nausea today with ODT zofran 4 mg. He Improved prior to leaving today.

## 2021-04-09 NOTE — Assessment & Plan Note (Signed)
Infrequent use of albuterol inhaler, continue to monitor. No wheezing on exam.

## 2021-04-09 NOTE — Assessment & Plan Note (Signed)
Deteriorated. Changing citalopram 40 mg to lexapro 10 mg. Consider dose increase to 20 mg in a few weeks. He will update.

## 2021-04-09 NOTE — Patient Instructions (Signed)
Stop taking citalopram 40 mg.  Start taking escitalopram 10 mg (Lexapro) for anxiety and depression.   Stop by the lab prior to leaving today. I will notify you of your results once received.   Please update me via My Chart in a few weeks regarding your symptoms.  It was a pleasure to see you today!  Preventive Care 4-33 Years Old, Male Preventive care refers to lifestyle choices and visits with your health care provider that can promote health and wellness. This includes: A yearly physical exam. This is also called an annual wellness visit. Regular dental and eye exams. Immunizations. Screening for certain conditions. Healthy lifestyle choices, such as: Eating a healthy diet. Getting regular exercise. Not using drugs or products that contain nicotine and tobacco. Limiting alcohol use. What can I expect for my preventive care visit? Physical exam Your health care provider may check your: Height and weight. These may be used to calculate your BMI (body mass index). BMI is a measurement that tells if you are at a healthy weight. Heart rate and blood pressure. Body temperature. Skin for abnormal spots. Counseling Your health care provider may ask you questions about your: Past medical problems. Family's medical history. Alcohol, tobacco, and drug use. Emotional well-being. Home life and relationship well-being. Sexual activity. Diet, exercise, and sleep habits. Work and work Astronomer. Access to firearms. What immunizations do I need?  Vaccines are usually given at various ages, according to a schedule. Your health care provider will recommend vaccines for you based on your age, medicalhistory, and lifestyle or other factors, such as travel or where you work. What tests do I need? Blood tests Lipid and cholesterol levels. These may be checked every 5 years starting at age 31. Hepatitis C test. Hepatitis B test. Screening  Diabetes screening. This is done by checking  your blood sugar (glucose) after you have not eaten for a while (fasting). Genital exam to check for testicular cancer or hernias. STD (sexually transmitted disease) testing, if you are at risk. Talk with your health care provider about your test results, treatment options,and if necessary, the need for more tests. Follow these instructions at home: Eating and drinking  Eat a healthy diet that includes fresh fruits and vegetables, whole grains, lean protein, and low-fat dairy products. Drink enough fluid to keep your urine pale yellow. Take vitamin and mineral supplements as recommended by your health care provider. Do not drink alcohol if your health care provider tells you not to drink. If you drink alcohol: Limit how much you have to 0-2 drinks a day. Be aware of how much alcohol is in your drink. In the [place] one drink equals one 12 oz bottle of beer (355 mL), one 5 oz glass of wine (148 mL), or one 1 oz glass of hard liquor (44 mL).  Lifestyle Take daily care of your teeth and gums. Brush your teeth every morning and night with fluoride toothpaste. Floss one time each day. Stay active. Exercise for at least 30 minutes 5 or more days each week. Do not use any products that contain nicotine or tobacco, such as cigarettes, e-cigarettes, and chewing tobacco. If you need help quitting, ask your health care provider. Do not use drugs. If you are sexually active, practice safe sex. Use a condom or other form of protection to prevent STIs (sexually transmitted infections). Find healthy ways to cope with stress, such as: Meditation, yoga, or listening to music. Journaling. Talking to a trusted person. Spending time with friends  and family. Safety Always wear your seat belt while driving or riding in a vehicle. Do not drive: If you have been drinking alcohol. Do not ride with someone who has been drinking. When you are tired or distracted. While texting. Wear a helmet and other protective  equipment during sports activities. If you have firearms in your house, make sure you follow all gun safety procedures. Seek help if you have been physically or sexually abused. What's next? Go to your health care provider once a year for an annual wellness visit. Ask your health care provider how often you should have your eyes and teeth checked. Stay up to date on all vaccines. This information is not intended to replace advice given to you by your health care provider. Make sure you discuss any questions you have with your healthcare provider. Document Revised: 06/22/2019 Document Reviewed: 09/30/2018 Elsevier Patient Education  2022 [employer].

## 2021-04-09 NOTE — Progress Notes (Signed)
Subjective:    Patient ID: Clifford Hall, male    DOB: [birth year], 35 y.o.   MRN: [id number]  HPI  Clifford Hall is a very pleasant 35 y.o. male who presents today for complete physical.  History of frequent headaches for years, originating from one of his shoulders radiating up to the base of the occipital lobe. He will experience photo/phonophobia and nausea with headaches. He has one today with nausea. He will take ibuprofen during each of his headaches. Headaches will occur when he skips meals, with stress, and when not sleeping well. He's recently not been sleeping well.   He'd also like to mention recurrent "odd dreams" such as dreaming that his children have been injured/murdered. This began about 6 months ago with a dose increase of citalopram to 40 mg. Also with inability to climax. He does feel improved in regard to anxiety and depression on Citalopram.   Immunizations: -Tetanus: 2018 -Influenza: Due this season  -Covid-19: 2 vaccines  Diet: Poor diet, active at work Exercise: No regular exercise.  Eye exam: No recent exam  Dental exam: No recent exam   BP Readings from Last 3 Encounters:  04/09/21 120/79  11/21/20 132/80  03/30/20 110/66        Review of Systems  Constitutional:  Negative for unexpected weight change.  HENT:  Negative for rhinorrhea.   Respiratory:  Negative for shortness of breath.   Cardiovascular:  Negative for chest pain.  Gastrointestinal:  Positive for nausea. Negative for constipation and diarrhea.  Genitourinary:  Negative for difficulty urinating.  Musculoskeletal:  Negative for arthralgias and myalgias.  Skin:  Negative for rash.  Allergic/Immunologic: Negative for environmental allergies.  Neurological:  Positive for headaches. Negative for dizziness.  Psychiatric/Behavioral:         See HPI        Past Medical History:  Diagnosis Date   Asthma    History of asthma    History of chickenpox     History of headache    Hymenoptera allergy     Social History   Socioeconomic History   Marital status: Married    Spouse name: Not on file   Number of children: Not on file   Years of education: Not on file   Highest education level: Not on file  Occupational History   Not on file  Tobacco Use   Smoking status: Never   Smokeless tobacco: Never  Vaping Use   Vaping Use: Never used  Substance and Sexual Activity   Alcohol use: Yes    Comment: Occationally - once or twice monthly   Drug use: No   Sexual activity: Not on file  Other Topics Concern   Not on file  Social History Narrative   Married.   3 children, 1 child on the way.   Works in Radiation protection practitioner.    Enjoys playing video games, spending time with family.   Social Determinants of Health   Financial Resource Strain: Not on file  Food Insecurity: Not on file  Transportation Needs: Not on file  Physical Activity: Not on file  Stress: Not on file  Social Connections: Not on file  Intimate Partner Violence: Not on file    Past Surgical History:  Procedure Laterality Date   I & D EXTREMITY  09/28/2012   Procedure: IRRIGATION AND DEBRIDEMENT EXTREMITY;  Surgeon: Dominica Severin, MD;  Location: [facility] OR;  Service: Orthopedics;  Laterality: Left;   NERVE AND TENDON REPAIR  09/28/2012   Procedure: NERVE AND TENDON REPAIR;  Surgeon: Dominica Severin, MD;  Location: [facility] OR;  Service: Orthopedics;  Laterality: Left;   WISDOM TOOTH EXTRACTION  2008    Family History  Problem Relation Age of Onset   Kidney Stones Father    Hyperlipidemia Father    Gout Father    Thyroid disease Sister    Cancer Maternal Aunt    Thyroid disease Maternal Aunt    Heart disease Maternal Grandfather    Stroke Maternal Grandfather    Kidney Stones Maternal Grandfather    Heart disease Paternal Grandfather    Kidney Stones Paternal Grandfather    Gout Paternal Grandfather    Leukemia Paternal Grandfather    Hyperlipidemia Mother     Gout Paternal Grandmother     Allergies  Allergen Reactions   Bee Venom Anaphylaxis    Current Outpatient Medications on File Prior to Visit  Medication Sig Dispense Refill   albuterol (VENTOLIN HFA) 108 (90 Base) MCG/ACT inhaler Inhale 2 puffs into the lungs every 6 (six) hours as needed for wheezing or shortness of breath. 18 g 1   citalopram (CELEXA) 40 MG tablet Take 1 tablet (40 mg total) by mouth daily. For anxiety and depression. 90 tablet 1   diphenhydrAMINE (BENADRYL) 25 MG tablet Take 50 mg by mouth every 6 (six) hours as needed.     EPINEPHrine (AUVI-Q) 0.3 mg/0.3 mL IJ SOAJ injection Inject 0.3 mLs (0.3 mg total) into the muscle as needed for anaphylaxis. 1 each 0   ibuprofen (ADVIL,MOTRIN) 200 MG tablet Take 400 mg by mouth every 6 (six) hours as needed. For pain     ondansetron (ZOFRAN ODT) 4 MG disintegrating tablet Take 1 tablet (4 mg total) by mouth every 8 (eight) hours as needed for nausea or vomiting. 12 tablet 0   No current facility-administered medications on file prior to visit.    There were no vitals taken for this visit. Objective:   Physical Exam HENT:     Right Ear: Tympanic membrane and ear canal normal.     Left Ear: Tympanic membrane and ear canal normal.     Nose: Nose normal.     Right Sinus: No maxillary sinus tenderness or frontal sinus tenderness.     Left Sinus: No maxillary sinus tenderness or frontal sinus tenderness.  Eyes:     Conjunctiva/sclera: Conjunctivae normal.  Neck:     Thyroid: No thyromegaly.     Vascular: No carotid bruit.  Cardiovascular:     Rate and Rhythm: Normal rate and regular rhythm.     Heart sounds: Normal heart sounds.  Pulmonary:     Effort: Pulmonary effort is normal.     Breath sounds: Normal breath sounds. No wheezing or rales.  Abdominal:     General: Bowel sounds are normal.     Palpations: Abdomen is soft.     Tenderness: There is no abdominal tenderness.  Musculoskeletal:        General: Normal range  of motion.     Cervical back: Neck supple.  Skin:    General: Skin is warm and dry.  Neurological:     Mental Status: He is alert and oriented to person, place, and time.     Cranial Nerves: No cranial nerve deficit.     Deep Tendon Reflexes: Reflexes are normal and symmetric.  Psychiatric:        Mood and Affect: Mood normal.  Assessment & Plan:      This visit occurred during the SARS-CoV-2 public health emergency.  Safety protocols were in place, including screening questions prior to the visit, additional usage of staff PPE, and extensive cleaning of exam room while observing appropriate contact time as indicated for disinfecting solutions.

## 2021-04-09 NOTE — Assessment & Plan Note (Signed)
Improved on increased dose of citalopram 40 mg, seems to be causing nightmares and erectile side effects.   Will switch to Lexapro 10 mg for now, can increase to 20 mg thereafter. He will update.  

## 2021-04-10 LAB — LDL CHOLESTEROL, DIRECT: Direct LDL: 152 mg/dL

## 2021-04-10 LAB — COMPREHENSIVE METABOLIC PANEL
ALT: 28 U/L (ref 0–53)
AST: 21 U/L (ref 0–37)
Albumin: 4.5 g/dL (ref 3.5–5.2)
Alkaline Phosphatase: 82 U/L (ref 39–117)
BUN: 17 mg/dL (ref 6–23)
CO2: 25 mEq/L (ref 19–32)
Calcium: 9.6 mg/dL (ref 8.4–10.5)
Chloride: 101 mEq/L (ref 96–112)
Creatinine, Ser: 0.85 mg/dL (ref 0.40–1.50)
GFR: 114.46 mL/min (ref >60.00)
Glucose, Bld: 102 mg/dL — ABNORMAL HIGH (ref 70–99)
Potassium: 3.9 mEq/L (ref 3.5–5.1)
Sodium: 136 mEq/L (ref 135–145)
Total Bilirubin: 0.3 mg/dL (ref 0.2–1.2)
Total Protein: 7.2 g/dL (ref 6.0–8.3)

## 2021-04-10 LAB — LIPID PANEL
Cholesterol: 231 mg/dL — ABNORMAL HIGH (ref 0–200)
HDL: 39.2 mg/dL (ref >39.00)
NonHDL: 192.25
Total CHOL/HDL Ratio: 6
Triglycerides: 311 mg/dL — ABNORMAL HIGH (ref 0.0–149.0)
VLDL: 62.2 mg/dL — ABNORMAL HIGH (ref 0.0–40.0)

## 2021-04-10 LAB — TSH: TSH: 0.85 u[IU]/mL (ref 0.35–4.50)

## 2021-07-05 ENCOUNTER — Other Ambulatory Visit: Payer: Self-pay | Admitting: Primary Care

## 2021-07-05 DIAGNOSIS — F331 Major depressive disorder, recurrent, moderate: Secondary | ICD-10-CM

## 2021-07-05 DIAGNOSIS — F411 Generalized anxiety disorder: Secondary | ICD-10-CM

## 2021-09-06 IMAGING — DX DG HAND COMPLETE 3+V*R*
3 series · 3 of 3 positions shown · non-contrast
Comparison: None.

CLINICAL DATA: Punched wall with right hand with pain.

EXAM:
RIGHT HAND - COMPLETE 3+ VIEW

[hand ap]
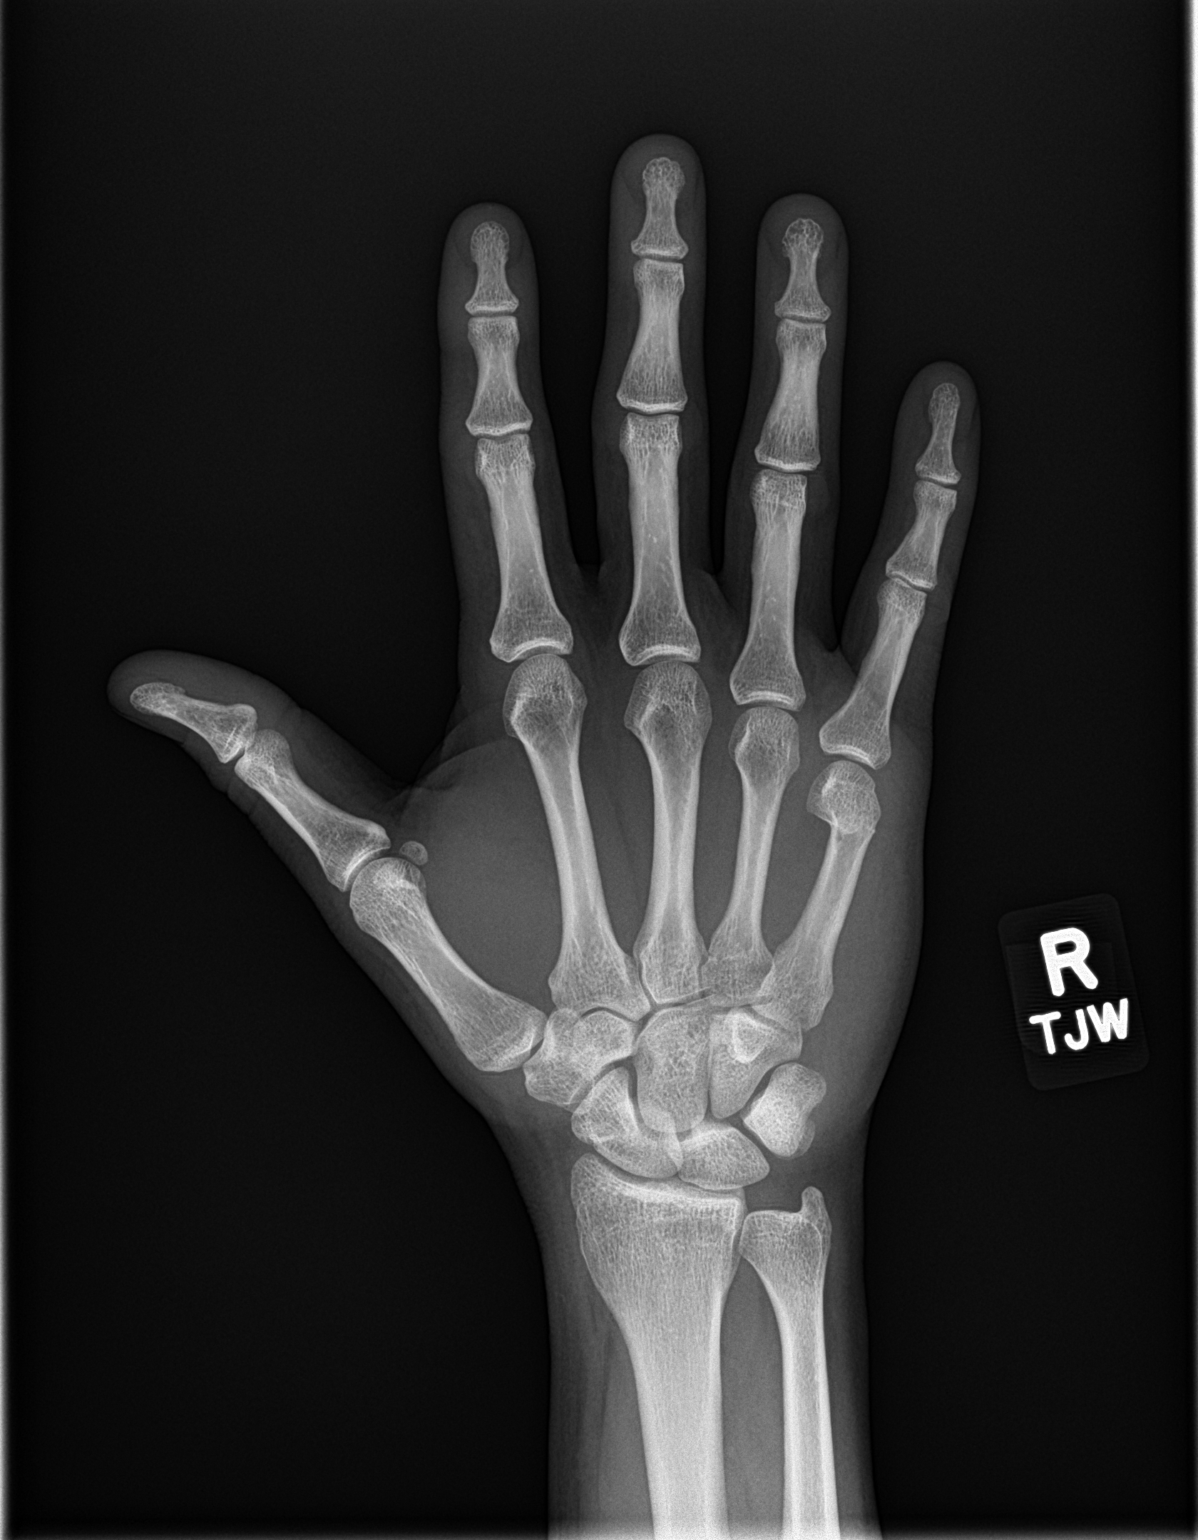

[hand obl]
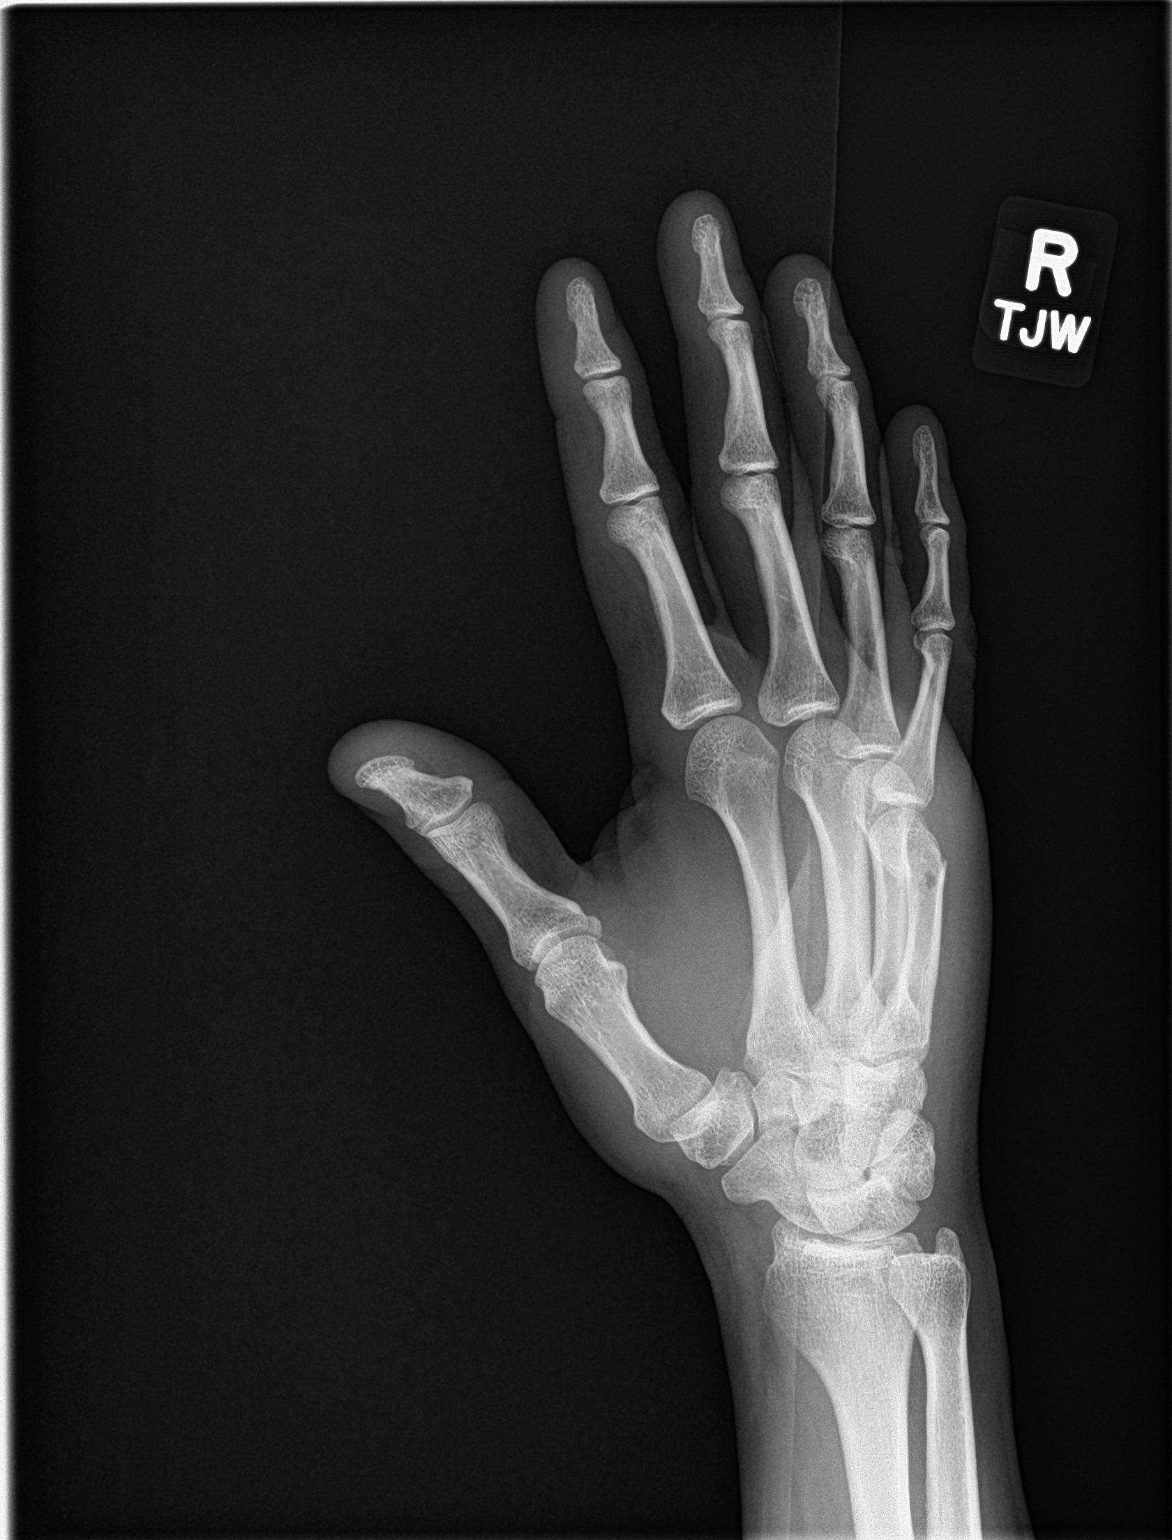

[hand lat]
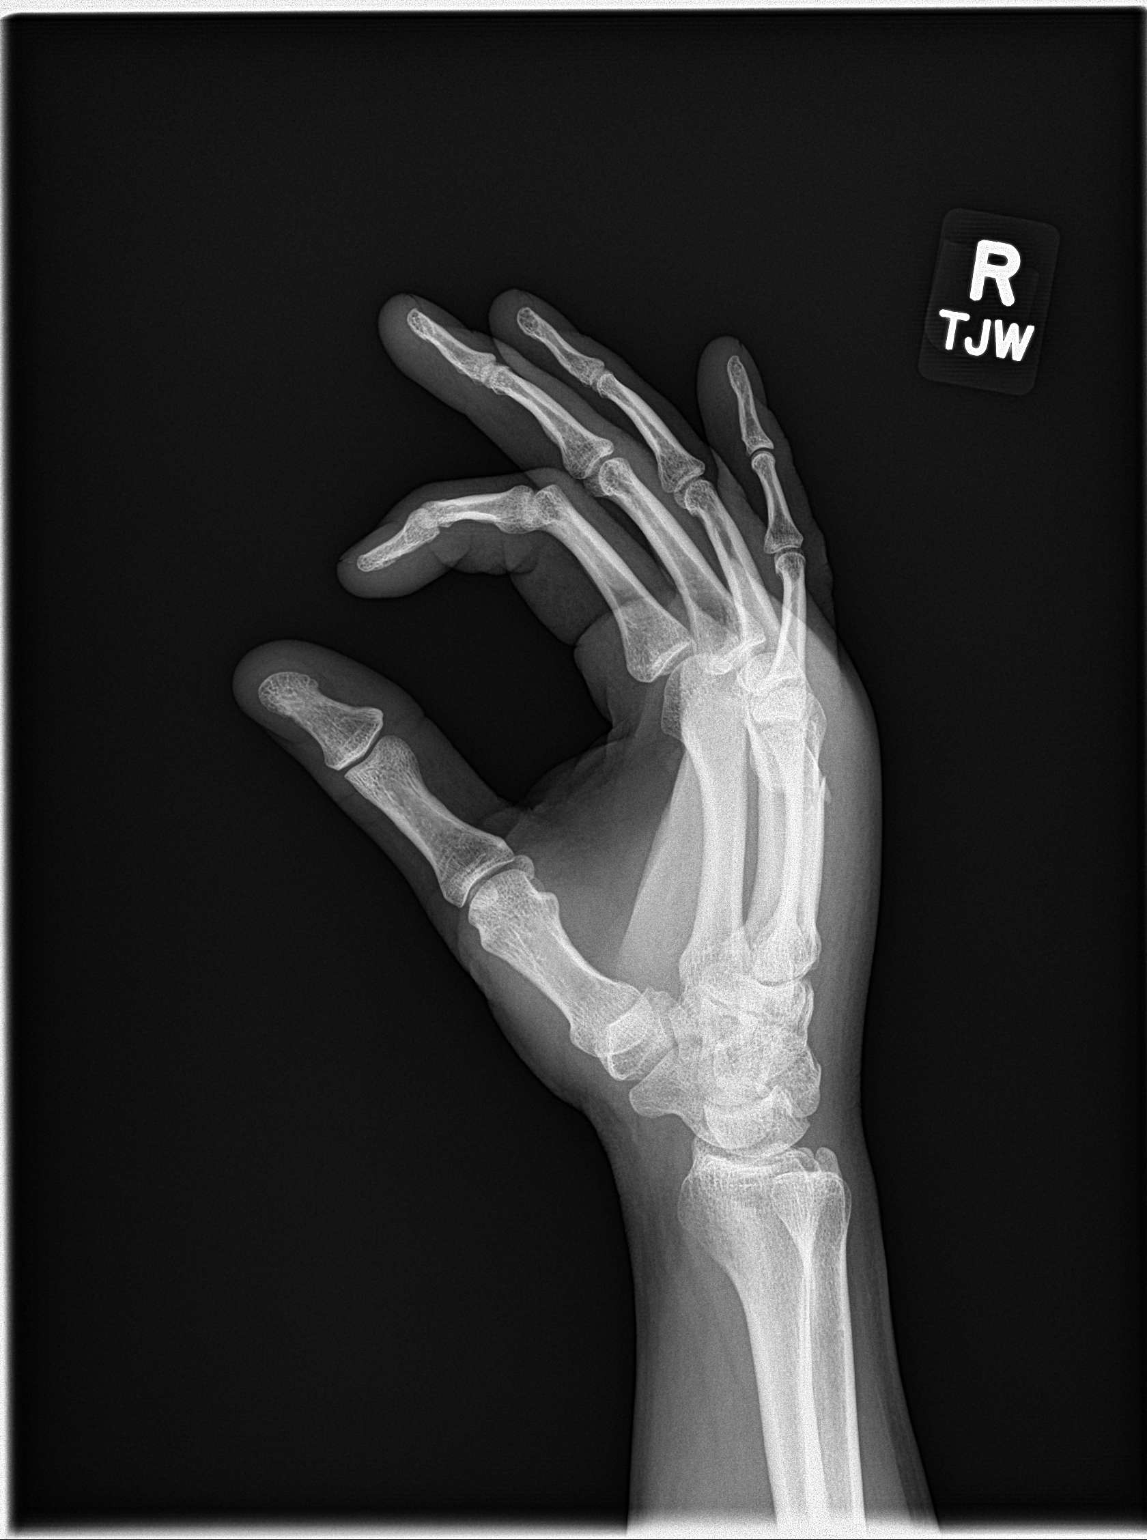

[3 of 3 positions shown; findings below may reference images not displayed]

FINDINGS: Displaced distal fifth metacarpal fracture. Volar angulation of the
distal fragment. Remaining bones, joint spaces and soft tissues are
unremarkable.
IMPRESSION: Minimally displaced and angulated distal fifth metacarpal fracture.

## 2022-03-03 ENCOUNTER — Telehealth: Payer: Self-pay

## 2022-03-03 ENCOUNTER — Encounter: Payer: Self-pay | Admitting: Family Medicine

## 2022-03-03 ENCOUNTER — Ambulatory Visit (INDEPENDENT_AMBULATORY_CARE_PROVIDER_SITE_OTHER): Payer: No Typology Code available for payment source | Admitting: Family Medicine

## 2022-03-03 ENCOUNTER — Ambulatory Visit (INDEPENDENT_AMBULATORY_CARE_PROVIDER_SITE_OTHER)
Admission: RE | Admit: 2022-03-03 | Discharge: 2022-03-03 | Disposition: A | Discharge status: Home / Self Care | Payer: No Typology Code available for payment source | Source: Ambulatory Visit | Attending: Family Medicine | Admitting: Family Medicine

## 2022-03-03 VITALS — BP 108/62 | HR 86 | Temp 98.3°F | Ht 68.0 in | Wt 197.1 lb

## 2022-03-03 DIAGNOSIS — M79672 Pain in left foot: Secondary | ICD-10-CM | POA: Diagnosis not present

## 2022-03-03 DIAGNOSIS — S93522A Sprain of metatarsophalangeal joint of left great toe, initial encounter: Secondary | ICD-10-CM | POA: Diagnosis not present

## 2022-03-03 MED ORDER — DICLOFENAC SODIUM 75 MG PO TBEC: 75.0000 mg | DELAYED_RELEASE_TABLET | Freq: Two times a day (BID) | ORAL | 1 refills | Status: DC

## 2022-03-03 NOTE — Telephone Encounter (Signed)
[facility] Night - Client ?TELEPHONE ADVICE RECORD ?AccessNurse? ?Patient ?Name: ?[name] ?[name] ?OBINSON ?Gender: Male ?DOB: [birth year] ?Age: 36 Y 62 D ?Return ?Phone ?Number: ?[phone] ?(Primary), ?[id number] ?(Secondary) ?Address: ?City/ ?State/ ?Zip: [name] ?[address] ?Client [facility] Night - Client ?Client Site [facility] - Night ?Provider Vernona Rieger - NP ?Contact Type Call ?Who Is Calling Patient / Member / Family / Caregiver ?Call Type Triage / Clinical ?Relationship To Patient Self ?Return Phone Number [phone] (Primary) ?Chief Complaint Toe Injury ?Reason for Call Request to Schedule Office Appointment ?Initial Comment Caller states he would like to schedule an ?appointment for today. He has an injury on his toe ?and is swollen, has no range of motion, is red and ?hot. ?Translation No ?No Triage Reason Patient declined ?Nurse Assessment ?Nurse: Clare Gandy RN, Dahlia Client Date/Time (Eastern Time): 03/03/2022 4:07:55 PM ?Confirm and document reason for call. If ?symptomatic, describe symptoms. ?---Caller reports he has a toe injury and red hot and ?swollen. Reports was just seen in office by MD. ?Declines triage. ?Does the patient have any new or worsening ?symptoms? ---Yes ?Will a triage be completed? ---No ?Select reason for no triage. ---Patient declined ?Disp. Time (Eastern ?Time) Disposition Final User ?03/03/2022 9:13:29 AM Attempt made - no message left Conner, RN, Lesa ?03/03/2022 9:14:33 AM Attempt made - no message left Conner, RN, Lesa ?03/03/2022 9:27:04 AM Attempt made - no message left Conner, RN, Lesa ?03/03/2022 9:38:52 AM FINAL ATTEMPT MADE - no ?message left ?Conner, RN, Gean Maidens ?03/03/2022 9:38:58 AM Send to RN Final Attempt Orlene Och, RN, Lesa ?03/03/2022 4:12:34 PM Clinical Call Yes Clare Gandy, RN, Lorelle Formosa ?

## 2022-03-03 NOTE — Progress Notes (Signed)
? ? ?Clifford Hall T. [name], MD, CAQ Sports Medicine ?Nature conservation officer at [facility] ?[address] [place] ?[place], [address] ? ?Phone: [phone]  FAX: [phone] ? ?Clifford Hall - 36 y.o. male  MRN [id number]  Date of Birth: 29-Jun-1986 ? ?Date: 03/03/2022  PCP: Doreene Nest, NP  Referral: Doreene Nest, NP ? ?Chief Complaint  ?Patient presents with  ? Foot Injury  ?  Injured Friday, this has happened several times in the past year  ? ?Subjective:  ? ?Clifford Hall is a 36 y.o. very pleasant male patient with Body mass index is 29.97 kg/m?. who presents with the following: ? ?Kicked a trailer last Friday. ? ?On Friday kicked a trailer by accident and subsequently developed some pain around the first MTP joint.  Since then has been swollen and painful is having trouble with pronation.  He has been walking and extreme supination to try to compensate for this and take load off of the great toe. ? ?He does have a family history of gout, prior uric acid level is reportedly normal.  I do not see 1 at least in epic. ? ?Review of Systems is noted in the HPI, as appropriate ? ?Objective:  ? ?BP 108/62   Pulse 86   Temp 98.3 ?F (36.8 ?C) (Oral)   Ht 5\' 8"  (1.727 m)   Wt 197 lb 2 oz (89.4 kg)   SpO2 98%   BMI 29.97 kg/m?  ? ?GEN: No acute distress; alert,appropriate. ?PULM: Breathing comfortably in no respiratory distress ?PSYCH: Normally interactive.  ? ?He walks with significant antalgia.  Quite significant supination to avoid pronation and pushoff. ? ?Nontender throughout the tibia, fibula, ankle, talus joint, calcaneus, as well as the entirety of the midfoot.  All tendon structures appear intact. ? ?Does have notable tenderness around the first MTP proximal and distal. ? ?He also has minimal tenderness on phalanges 2 through 5 metacarpals 2 through 5. ?He does have extensive soft tissue swelling and pain with flexion and extension of the great toe. ? ?Laboratory  and Imaging Data: ?The radiological images were independently reviewed by myself in the office and results were reviewed with the patient. My independent interpretation of images:   ?XR, 3 view, AP, lateral, and oblique ?Indication: foot pain ?Findings: no evidence of acute fracture or dislocation  ?Electronically Signed  By: , MD On: 03/03/2022  2:20 PM EDT  ? ?Assessment and Plan:  ? ?  ICD-10-CM   ?1. Turf toe of left foot  S93.522A   ?  ?2. Acute foot pain, left  M79.672 DG Foot Complete Right  ?  Uric acid  ?  CANCELED: Uric acid  ?  ? ?Acute injury, post trauma without fracture, localized to the MTP on the first.  Capsular injury suspected, turf toe and will continue with conservative management. ? ?Relatively immobile shoe in the forefoot, also gave him some Poron padding.  He can follow-up if is not improving. ? ?Social: This is causing some disability with walking and potentially exercise. ? ?Medication Management during today's office visit: ?Meds ordered this encounter  ?Medications  ? diclofenac (VOLTAREN) 75 MG EC tablet  ?  Sig: Take 1 tablet (75 mg total) by mouth 2 (two) times daily.  ?  Dispense:  60 tablet  ?  Refill:  1  ? ?There are no discontinued medications. ? ?Orders placed today for conditions managed today: ?Orders Placed This Encounter  ?Procedures  ? DG Foot  Complete Right  ? Uric acid  ? ? ?Follow-up if needed: No follow-ups on file. ? ?Dragon Medical One speech-to-text software was used for transcription in this dictation.  Possible transcriptional errors can occur using Animal nutritionist.  ? ?Signed, ? ?Waunita Sandstrom T. Brystal Kildow, MD ? ? ?Outpatient Encounter Medications as of 03/03/2022  ?Medication Sig  ? albuterol (VENTOLIN HFA) 108 (90 Base) MCG/ACT inhaler Inhale 2 puffs into the lungs every 6 (six) hours as needed for wheezing or shortness of breath.  ? diclofenac (VOLTAREN) 75 MG EC tablet Take 1 tablet (75 mg total) by mouth 2 (two) times daily.  ? diphenhydrAMINE  (BENADRYL) 25 MG tablet Take 50 mg by mouth every 6 (six) hours as needed.  ? EPINEPHrine (AUVI-Q) 0.3 mg/0.3 mL IJ SOAJ injection Inject 0.3 mLs (0.3 mg total) into the muscle as needed for anaphylaxis.  ? ibuprofen (ADVIL,MOTRIN) 200 MG tablet Take 400 mg by mouth every 6 (six) hours as needed. For pain  ? ondansetron (ZOFRAN ODT) 4 MG disintegrating tablet Take 1 tablet (4 mg total) by mouth every 8 (eight) hours as needed for nausea or vomiting.  ? escitalopram (LEXAPRO) 10 MG tablet Take 1 tablet (10 mg total) by mouth daily. For anxiety. (Patient not taking: Reported on 03/03/2022)  ? ?No facility-administered encounter medications on file as of 03/03/2022.  ?  ?

## 2022-03-03 NOTE — Telephone Encounter (Signed)
Per appt notes pt has already seen Dr Patsy Lager this afternoon. Sending note to Dr Patsy Lager and Lupita Leash CMA as Lorain Childes. ?

## 2022-03-04 ENCOUNTER — Other Ambulatory Visit: Payer: No Typology Code available for payment source

## 2022-03-25 ENCOUNTER — Telehealth: Payer: Self-pay

## 2022-03-25 NOTE — Telephone Encounter (Signed)
I need to defer to PCP and/or Dr. Patsy Lager about this.  Thanks.

## 2022-03-25 NOTE — Telephone Encounter (Signed)
I will defer to treating physician.

## 2022-03-25 NOTE — Telephone Encounter (Signed)
Patient called in stating that he saw Dr.Copland a couple of days ago and wants to know if he can get a note for his gym to excuse this months payment.

## 2022-03-26 ENCOUNTER — Other Ambulatory Visit (INDEPENDENT_AMBULATORY_CARE_PROVIDER_SITE_OTHER): Payer: No Typology Code available for payment source

## 2022-03-26 DIAGNOSIS — M79672 Pain in left foot: Secondary | ICD-10-CM | POA: Diagnosis not present

## 2022-03-26 LAB — URIC ACID: Uric Acid, Serum: 9.3 mg/dL — ABNORMAL HIGH (ref 4.0–7.8)

## 2022-03-26 NOTE — Telephone Encounter (Signed)
Can you help me with this.  One month, no exercise / gym.  Ask for relief of one month's gym membership.  I will sign.

## 2022-03-26 NOTE — Telephone Encounter (Signed)
Clifford Hall notified, by telephone, that the letter he requested,has been sent to [name].

## 2022-03-26 NOTE — Telephone Encounter (Signed)
Letter written and sent to Dr. Patsy Lager to review and edit.

## 2022-03-26 NOTE — Telephone Encounter (Signed)
Printed, ready

## 2022-03-27 ENCOUNTER — Other Ambulatory Visit (HOSPITAL_COMMUNITY): Payer: Self-pay

## 2022-03-27 ENCOUNTER — Encounter: Payer: Self-pay | Admitting: Family Medicine

## 2022-03-27 MED ORDER — PREDNISONE 20 MG PO TABS
ORAL_TABLET | ORAL | 0 refills | Status: DC
  Filled 2022-03-27: qty 15, 10d supply, fill #0

## 2022-03-27 MED ORDER — PREDNISONE 20 MG PO TABS: ORAL_TABLET | ORAL | 0 refills | Status: DC

## 2022-03-27 NOTE — Addendum Note (Signed)
Addended by: Damita Lack on: 03/27/2022 02:24 PM   Modules accepted: Orders

## 2022-04-02 ENCOUNTER — Encounter: Payer: Self-pay | Admitting: Family Medicine

## 2022-04-02 ENCOUNTER — Ambulatory Visit (INDEPENDENT_AMBULATORY_CARE_PROVIDER_SITE_OTHER): Payer: No Typology Code available for payment source | Admitting: Family Medicine

## 2022-04-02 VITALS — BP 94/60 | HR 74 | Temp 98.3°F | Ht 68.0 in | Wt 192.4 lb

## 2022-04-02 DIAGNOSIS — Z87898 Personal history of other specified conditions: Secondary | ICD-10-CM

## 2022-04-02 DIAGNOSIS — M10072 Idiopathic gout, left ankle and foot: Secondary | ICD-10-CM

## 2022-04-02 MED ORDER — INDOMETHACIN 50 MG PO CAPS: 50.0000 mg | ORAL_CAPSULE | Freq: Three times a day (TID) | ORAL | 2 refills | Status: DC

## 2022-04-02 MED ORDER — COLCHICINE 0.6 MG PO TABS: 0.6000 mg | ORAL_TABLET | Freq: Two times a day (BID) | ORAL | 2 refills | Status: DC

## 2022-04-02 MED ORDER — ONDANSETRON 4 MG PO TBDP: 4.0000 mg | ORAL_TABLET | Freq: Three times a day (TID) | ORAL | 0 refills | Status: DC | PRN

## 2022-04-02 NOTE — Patient Instructions (Signed)
Low-Purine Eating Plan A low-purine eating plan involves making food choices to limit your purine intake. Purine is a kind of uric acid. Too much uric acid in your blood can cause certain conditions, such as gout and kidney stones. Eating a low-purine diet may help control these conditions. What are tips for following this plan? Shopping Avoid buying products that contain high-fructose corn syrup. Check for this on food labels. It is commonly found in many processed foods and soft drinks. Be sure to check for it in baked goods such as cookies, canned fruits, and cereals and cereal bars. Avoid buying veal, chicken breast with skin, lamb, and organ meats such as liver. These types of meats tend to have the highest purine content. Choose dairy products. These may lower uric acid levels. Avoid certain types of fish. Not all fish and seafood have high purine content. Examples with high purine content include anchovies, trout, tuna, sardines, and salmon. Avoid buying beverages that contain alcohol, particularly beer and hard liquor. Alcohol can affect the way your body gets rid of uric acid. Meal planning  Learn which foods do or do not affect you. If you find out that a food tends to cause your gout symptoms to flare up, avoid eating that food. You can enjoy foods that do not cause problems. If you have any questions about a food item, talk with your dietitian or health care provider. Reduce the overall amount of meat in your diet. When you do eat meat, choose ones with lower purine content. Include plenty of fruits and vegetables. Although some vegetables may have a high purine content--such as asparagus, mushrooms, spinach, or cauliflower--it has been shown that these do not contribute to uric acid blood levels as much. Consume at least 1 dairy serving a day. This has been shown to decrease uric acid levels. General information If you drink alcohol: Limit how much you have to: 0-1 drink a day for  women who are not pregnant. 0-2 drinks a day for men. Know how much alcohol is in a drink. In the [place] one drink equals one 12 oz bottle of beer (355 mL), one 5 oz glass of wine (148 mL), or one 1 oz glass of hard liquor (44 mL). Drink plenty of water. Try to drink enough to keep your urine pale yellow. Fluids can help remove uric acid from your body. Work with your health care provider and dietitian to develop a plan to achieve or maintain a healthy weight. Losing weight may help reduce uric acid in your blood. What foods are recommended? The following are some types of foods that are good choices when limiting purine intake: Fresh or frozen fruits and vegetables. Whole grains, breads, cereals, and pasta. Rice. Beans, peas, legumes. Nuts and seeds. Dairy products. Fats and oils. The items listed above may not be a complete list. Talk with a dietitian about what dietary choices are best for you. What foods are not recommended? Limit your intake of foods high in purines, including: Beer and other alcohol. Meat-based gravy or sauce. Canned or fresh fish, such as: Anchovies, sardines, herring, salmon, and tuna. Mussels and scallops. Codfish, trout, and haddock. Bacon, veal, chicken breast with skin, and lamb. Organ meats, such as: Liver or kidney. Tripe. Sweetbreads (thymus gland or pancreas). Wild game or goose. Yeast or yeast extract supplements. Drinks sweetened with high-fructose corn syrup, such as soda. Processed foods made with high-fructose corn syrup. The items listed above may not be a complete list of foods   and beverages you should limit. Contact a dietitian for more information. Summary Eating a low-purine diet may help control conditions caused by too much uric acid in the body, such as gout or kidney stones. Choose low-purine foods, limit alcohol, and limit high-fructose corn syrup. You will learn over time which foods do or do not affect you. If you find out that a  food tends to cause your gout symptoms to flare up, avoid eating that food. This information is not intended to replace advice given to you by your health care provider. Make sure you discuss any questions you have with your health care provider. Document Revised: 09/19/2021 Document Reviewed: 09/19/2021 Elsevier Patient Education  2023 Elsevier Inc.  

## 2022-04-02 NOTE — Progress Notes (Signed)
Clifford Kroening T. Clifford Bennette, MD, CAQ Sports Medicine [facility] [address] [place], [address]  Phone: [phone]  FAX: [phone]  Clifford Hall - 36 y.o. male  MRN [id number]  Date of Birth: 10-29-1985  Date: 04/02/2022  PCP: Doreene Nest, NP  Referral: Doreene Nest, NP  Chief Complaint  Patient presents with   Follow-up    On lab work and Big Toe on foot   Subjective:   Clifford Hall is a 36 y.o. very pleasant male patient with Body mass index is 29.26 kg/m. who presents with the following:  F/u toe gout.  Initially I saw the patient in May 2023, and at that point I thought that he had a turf toe on the left foot.  He had had a relatively recent trauma.  Prior to this he had had a painful toe, and it was thought to be possibly cellulitis and was treated with antibiotics.  Ultimately, did get a uric acid which was elevated greater than 9.  I did place him on some oral prednisone, and he has been improving since this, but he is only taken 2 doses.  Has taken a couple of dose of steroids and   Initially, Jae Dire felt like infection, then flared up a bit.   Review of Systems is noted in the HPI, as appropriate  Objective:   BP 94/60   Pulse 74   Temp 98.3 F (36.8 C) (Oral)   Ht 5\' 8"  (1.727 m)   Wt 192 lb 7 oz (87.3 kg)   SpO2 98%   BMI 29.26 kg/m   GEN: No acute distress; alert,appropriate. PULM: Breathing comfortably in no respiratory distress PSYCH: Normally interactive.   Mildly swollen first MTP without any redness at this point.  There is also some mild tenderness to palpation at the joint line.  The remainder the foot is nontender.  Laboratory and Imaging Data: Results for orders placed or performed in visit on 03/26/22  Uric acid  Result Value Ref Range   Uric Acid, Serum 9.3 (H) 4.0 - 7.8 mg/dL   s  Assessment and Plan:     ICD-10-CM   1. Acute idiopathic gout  involving toe of left foot  M10.072     2. History of motion sickness  Z87.898 ondansetron (ZOFRAN ODT) 4 MG disintegrating tablet     Acute on chronic gout with exacerbation.  We discussed gout in detail, purine diet, and how to manage gout.  I want him to continue his course of prednisone.  For the future I am getting give him some indomethacin along with colchicine to take ASAP when he feels potential gout flare flaring up.  Father also has some severe gout.  Medication Management during today's office visit: Meds ordered this encounter  Medications   ondansetron (ZOFRAN ODT) 4 MG disintegrating tablet    Sig: Take 1 tablet (4 mg total) by mouth every 8 (eight) hours as needed for nausea or vomiting.    Dispense:  20 tablet    Refill:  0   indomethacin (INDOCIN) 50 MG capsule    Sig: Take 1 capsule (50 mg total) by mouth 3 (three) times daily with meals.    Dispense:  50 capsule    Refill:  2   colchicine 0.6 MG tablet    Sig: Take 1 tablet (0.6 mg total) by mouth 2 (two) times daily.    Dispense:  50 tablet  Refill:  2   Medications Discontinued During This Encounter  Medication Reason   escitalopram (LEXAPRO) 10 MG tablet Completed Course   ondansetron (ZOFRAN ODT) 4 MG disintegrating tablet Reorder   diclofenac (VOLTAREN) 75 MG EC tablet     Orders placed today for conditions managed today: No orders of the defined types were placed in this encounter.   Follow-up if needed: No follow-ups on file.  Dragon Medical One speech-to-text software was used for transcription in this dictation.  Possible transcriptional errors can occur using Animal nutritionist.   Signed,  Elpidio Galea. Taijuan Serviss, MD   Outpatient Encounter Medications as of 04/02/2022  Medication Sig   colchicine 0.6 MG tablet Take 1 tablet (0.6 mg total) by mouth 2 (two) times daily.   indomethacin (INDOCIN) 50 MG capsule Take 1 capsule (50 mg total) by mouth 3 (three) times daily with meals.   albuterol  (VENTOLIN HFA) 108 (90 Base) MCG/ACT inhaler Inhale 2 puffs into the lungs every 6 (six) hours as needed for wheezing or shortness of breath.   diphenhydrAMINE (BENADRYL) 25 MG tablet Take 50 mg by mouth every 6 (six) hours as needed.   EPINEPHrine (AUVI-Q) 0.3 mg/0.3 mL IJ SOAJ injection Inject 0.3 mLs (0.3 mg total) into the muscle as needed for anaphylaxis.   ibuprofen (ADVIL,MOTRIN) 200 MG tablet Take 400 mg by mouth every 6 (six) hours as needed. For pain   ondansetron (ZOFRAN ODT) 4 MG disintegrating tablet Take 1 tablet (4 mg total) by mouth every 8 (eight) hours as needed for nausea or vomiting.   predniSONE (DELTASONE) 20 MG tablet Take 2 tablets daily for 5 days, then 1 tablet daily for 5 days   [DISCONTINUED] diclofenac (VOLTAREN) 75 MG EC tablet Take 1 tablet (75 mg total) by mouth 2 (two) times daily.   [DISCONTINUED] escitalopram (LEXAPRO) 10 MG tablet Take 1 tablet (10 mg total) by mouth daily. For anxiety. (Patient not taking: Reported on 03/03/2022)   [DISCONTINUED] ondansetron (ZOFRAN ODT) 4 MG disintegrating tablet Take 1 tablet (4 mg total) by mouth every 8 (eight) hours as needed for nausea or vomiting.   No facility-administered encounter medications on file as of 04/02/2022.

## 2022-05-01 ENCOUNTER — Other Ambulatory Visit: Payer: Self-pay | Admitting: Family Medicine

## 2022-05-01 NOTE — Telephone Encounter (Signed)
Last office visit 04/02/22 for Gout with Dr. Patsy Lager.  Last refilled Diclofenac not on current medication list. No future appointments.

## 2022-07-15 ENCOUNTER — Ambulatory Visit
Admission: EM | Admit: 2022-07-15 | Discharge: 2022-07-15 | Disposition: A | Discharge status: Home / Self Care | Payer: No Typology Code available for payment source

## 2022-07-15 ENCOUNTER — Telehealth: Payer: No Typology Code available for payment source | Admitting: Physician Assistant

## 2022-07-15 ENCOUNTER — Ambulatory Visit: Payer: No Typology Code available for payment source | Admitting: Internal Medicine

## 2022-07-15 DIAGNOSIS — L03213 Periorbital cellulitis: Secondary | ICD-10-CM

## 2022-07-15 DIAGNOSIS — B309 Viral conjunctivitis, unspecified: Secondary | ICD-10-CM | POA: Diagnosis not present

## 2022-07-15 NOTE — ED Triage Notes (Signed)
Pt. States he woke up Saturday morning and states his left eye was swollen and red. Pt. Has been treating himself w/ Tobramycin eye drops since Monday.

## 2022-07-15 NOTE — Discharge Instructions (Addendum)
Your symptoms favor a viral etiology for your conjunctivitis today.  Please stop using the tobramycin and instead use TheraTears or antihistamine eyedrops for your itchiness.  We will watch and wait to see if bacterial symptoms develop.  If the swelling worsens, if you develop more discharge from your eyes, please return here or to your primary care provider for reevaluation of your symptoms.

## 2022-07-15 NOTE — Progress Notes (Signed)
Because of the swelling underneath the eye and note of fluid filled sacs under the skin, I feel your condition warrants further evaluation and I recommend that you be seen in a face to face visit. We need to make sure no concern for the start of a periorbital cellulitis. It is ok to continue the drops this morning until you can be evaluated in person.    NOTE: There will be NO CHARGE for this eVisit   If you are having a true medical emergency please call 911.      For an urgent face to face visit, [facility] [place] has seven urgent care centers for your convenience:     [facility] [place] Get Driving Directions [phone] [place], [place] [address]    [facility]) Get Driving Directions [phone] [place], [place] [address]  [facility] [place] ([place]) Get Driving Directions [phone] [address] [place],  [place]  [address]  [place] [facility] [place] [facility] [address] [place] Get Driving Directions  [phone] [place],  [place] [address]   [facility] Get Driving Directions [phone] [place], [place], [place] [address]   [facility] Get Driving Directions  [phone] [address] [place], [place] [address]   [facility] [place] Get Driving Directions [phone] [address], [place], [place] [address]  Your MyChart E-visit questionnaire answers were reviewed by a board certified advanced clinical practitioner to complete your personal care plan based on your specific symptoms.  Thank you for using e-Visits.

## 2022-07-15 NOTE — ED Provider Notes (Addendum)
Roderic Palau    CSN: [id number] Arrival date & time: 07/15/22  0815      History   Chief Complaint Chief Complaint  Patient presents with   Eye Drainage    HPI Clifford Hall is a 36 y.o. male.   HPI  Presents to primary care with complaint of left eye swollen and red since Saturday (3 days).  He reports self treating with tobramycin eyedrops since yesterday.    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/04/2019   Fatigue 07/04/2019   Preventative health care 11/23/2018   Mild intermittent asthma/exercise-induced bronchospasm 11/03/2018   History of motion sickness 11/03/2018   Dyspnea/wheezing 04/15/2016   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 Authorizing Provider  ondansetron (ZOFRAN) 8 MG tablet Take 1 tablet every 8 hours by oral route as needed for nausea for 5 days. 03/08/21  Yes [provider]  albuterol (VENTOLIN HFA) 108 (90 Base) MCG/ACT inhaler Inhale 2 puffs into the lungs every 6 (six) hours as needed for wheezing or shortness of  breath. 01/30/20   Bobbitt, Sedalia Muta, MD  azithromycin (ZITHROMAX) 250 MG tablet TAKE 2 TABLETS BY MOUTH TODAY, THEN TAKE 1 TABLET DAILY FOR 4 DAYS    [provider]  benzonatate (TESSALON) 100 MG capsule Take 1 capsule by mouth every 8 (eight) hours.    [provider]  cephALEXin (KEFLEX) 500 MG capsule TAKE 1 CAPSULE 4 TIMES A DAY BY ORAL ROUTE FOR 14 DAYS.    [provider]  citalopram (CELEXA) 20 MG tablet     [provider]  colchicine 0.6 MG tablet Take 1 tablet (0.6 mg total) by mouth 2 (two) times daily. 04/02/22   Copland, Frederico Hamman, MD  diazepam (VALIUM) 10 MG tablet     [provider]  diclofenac (VOLTAREN) 75 MG EC tablet Take 75 mg by mouth 2 (two) times daily. 04/03/22   [provider]  diphenhydrAMINE (BENADRYL) 25 MG tablet Take 50 mg by mouth every 6 (six) hours as needed.    [provider]  EPINEPHrine (AUVI-Q) 0.3 mg/0.3 mL IJ SOAJ injection Inject 0.3 mLs (0.3 mg total) into the muscle as needed for anaphylaxis. 01/30/20   Bobbitt, Sedalia Muta, MD  escitalopram (LEXAPRO) 10 MG tablet TAKE ONE TABLET DAILY AS NEEDED FOR ANXIETY AND DEPRESSION    [provider]  HYDROcodone-acetaminophen (NORCO/VICODIN) 5-325 MG tablet TAKE 1 TABLET EVERY 4-6 HOURS BY ORAL ROUTE AS NEEDED FOR PAIN FOR 5  DAYS.    [provider]  ibuprofen (ADVIL,MOTRIN) 200 MG tablet Take 400 mg by mouth every 6 (six) hours as needed. For pain    [provider]  indomethacin (INDOCIN) 50 MG capsule Take 1 capsule (50 mg total) by mouth 3 (three) times daily with meals. 04/02/22   Copland, Frederico Hamman, MD  ondansetron (ZOFRAN ODT) 4 MG disintegrating tablet Take 1 tablet (4 mg total) by mouth every 8 (eight) hours as needed for nausea or vomiting. 04/02/22   Copland, Frederico Hamman, MD  oxyCODONE (OXY IR/ROXICODONE) 5 MG immediate release tablet Take 1 tablet every 4-6 hours by oral route as needed for pain for 7 days.    [provider]  polyethylene glycol (MIRALAX / GLYCOLAX) 17 g packet INSTILL 3 DROPS INTO EACH EYE 4 TIMES DAILY    [provider]  predniSONE (DELTASONE) 20 MG tablet Take 2 tablets daily for 5 days, then 1 tablet daily for 5 days 03/27/22   Copland, Frederico Hamman, MD  sulfamethoxazole-trimethoprim (BACTRIM DS) 800-160 MG tablet     [provider]  traMADol (ULTRAM) 50 MG tablet     [provider]    Family History Family History  Problem Relation Age of Onset   Kidney Stones Father    Hyperlipidemia Father    Gout Father    Thyroid disease Sister    Cancer Maternal Aunt    Thyroid disease Maternal Aunt    Heart disease Maternal Grandfather    Stroke Maternal Grandfather    Kidney Stones Maternal Grandfather    Heart disease Paternal Grandfather    Kidney Stones Paternal Grandfather    Gout Paternal Grandfather    Leukemia Paternal Grandfather    Hyperlipidemia Mother    Gout Paternal Grandmother     Social History Social History   Tobacco Use   Smoking status: Never   Smokeless tobacco: Never  Vaping Use   Vaping Use: Never used  Substance Use Topics   Alcohol use: Yes    Comment: Occationally - once or twice monthly   Drug use: No     Allergies   Bee venom   Review of Systems Review of Systems   Physical Exam Triage Vital Signs ED Triage Vitals [07/15/22 0830]  Enc Vitals Group     BP 117/71     Pulse Rate 79     Resp 16     Temp 97.7 F (36.5 C)     Temp src      SpO2 98 %     Weight      Height      Head Circumference      Peak Flow      Pain Score 1     Pain Loc      Pain Edu?      Excl. in [place]?    No data found.  Updated Vital Signs BP 117/71   Pulse 79   Temp 97.7 F (36.5 C)   Resp 16   SpO2 98%   Visual Acuity Right Eye Distance:   Left Eye Distance:   Bilateral Distance:    Right Eye Near:   Left Eye Near:    Bilateral Near:     Physical Exam Vitals reviewed.  Constitutional:      Appearance:  Normal appearance.  Eyes:     Conjunctiva/sclera:     Right eye: Right conjunctiva is not injected. No exudate.    Left eye: Left conjunctiva is not injected. No  exudate.  Neurological:     Mental Status: He is alert.      UC Treatments / Results  Labs (all labs ordered are listed, but only abnormal results are displayed) Labs Reviewed - No data to display  EKG   Radiology No results found.  Procedures Procedures (including critical care time)  Medications Ordered in UC Medications - No data to display  Initial Impression / Assessment and Plan / UC Course  I have reviewed the triage vital signs and the nursing notes.  Pertinent labs & imaging results that were available during my care of the patient were reviewed by me and considered in my medical decision making (see chart for details).   Suspect left conjunctivitis however no erythema or injection is present.  No discharge is present.  Possibly occluded meibomian gland on the left lower eyelid rim near the inner canthus.  Some puffiness is present under the left eye.  Patient has been using tobramycin since yesterday.  This is possibly cleared up any bacterial symptoms if they were present, however based on the patient's HPI, suspect more of a viral etiology.  There is some occlusion of the gland and recommended moist warm compresses with gentle massage to loosen this occlusion.  Puffiness does not quite meet the standard of cellulitis and no current concern for preseptal cellulitis.  Recommended use of OTC allergy eyedrops to relieve the patient's reported itchiness.  Will watch and wait for additional symptoms of bacterial etiology.  Discussed this plan with the patient who acknowledged and agreed.   Final Clinical Impressions(s) / UC Diagnoses   Final diagnoses:  Viral conjunctivitis of left eye     Discharge Instructions      Your symptoms favor a viral etiology for your conjunctivitis today.  Please stop  using the tobramycin and instead use TheraTears or antihistamine eyedrops for your itchiness.  We will watch and wait to see if bacterial symptoms develop.  If the swelling worsens, if you develop more discharge from your eyes, please return here or to your primary care provider for reevaluation of your symptoms.     ED Prescriptions   None    PDMP not reviewed this encounter.   Rose Phi, [place] 07/15/22 [place], [place], [place] 07/15/22 [phone]

## 2022-10-15 ENCOUNTER — Ambulatory Visit
Admission: EM | Admit: 2022-10-15 | Discharge: 2022-10-15 | Disposition: A | Discharge status: Home / Self Care | Payer: No Typology Code available for payment source | Attending: Physician Assistant | Admitting: Physician Assistant

## 2022-10-15 DIAGNOSIS — R509 Fever, unspecified: Secondary | ICD-10-CM | POA: Diagnosis present

## 2022-10-15 DIAGNOSIS — J029 Acute pharyngitis, unspecified: Secondary | ICD-10-CM | POA: Insufficient documentation

## 2022-10-15 DIAGNOSIS — J019 Acute sinusitis, unspecified: Secondary | ICD-10-CM | POA: Diagnosis present

## 2022-10-15 DIAGNOSIS — R110 Nausea: Secondary | ICD-10-CM | POA: Diagnosis present

## 2022-10-15 LAB — GROUP A STREP BY PCR: Group A Strep by PCR: NOT DETECTED

## 2022-10-15 MED ORDER — AMOXICILLIN-POT CLAVULANATE 875-125 MG PO TABS: 1.0000 | ORAL_TABLET | Freq: Two times a day (BID) | ORAL | 0 refills | Status: DC

## 2022-10-15 MED ORDER — ONDANSETRON 4 MG PO TBDP: 4.0000 mg | ORAL_TABLET | Freq: Four times a day (QID) | ORAL | 0 refills | Status: AC | PRN

## 2022-10-15 NOTE — ED Triage Notes (Signed)
Pt c/o nausea,chills,congestion & sore throat x2 days. Has been around sick family.

## 2022-10-15 NOTE — Discharge Instructions (Addendum)
-  Negative strep test but given the new fever and the fact even over a month I suspect potentially have a bacterial infection Zosyn antibiotics. - We also discussed the possibility of the flu but I am unable to test you since you are out of test.  Care is supportive for that anyway. - I sent Zofran too.  Continue to treat your fever with Tylenol and Motrin.  You should be feeling better in the next couple of days.

## 2022-10-15 NOTE — ED Provider Notes (Signed)
[facility]    CSN: [id number] Arrival date & time: 10/15/22  0800      History   Chief Complaint Chief Complaint  Patient presents with   Sore Throat   Nausea   Chills    HPI Clifford Hall is a 36 y.o. male presenting for 2-day history of fever, fatigue, body aches, sore throat, chills, nausea and a dry cough.  He reports that his 3 children tested positive for strep over the weekend and his wife is also sick.  He also reports that he has been generally congested and coughing all months but the fever, fatigue, aches, chills and nausea are all new as of the past 2 days.  Taking OTC meds for symptoms.  He is otherwise healthy with a history of asthma.  Not reporting any shortness of breath, vomiting or diarrhea. No other complaints.  HPI  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/04/2019   Fatigue 07/04/2019   Preventative health care 11/23/2018   Mild intermittent asthma/exercise-induced bronchospasm 11/03/2018   History of motion sickness 11/03/2018   Dyspnea/wheezing 04/15/2016   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 Authorizing Provider  albuterol (VENTOLIN HFA) 108 (90 Base) MCG/ACT inhaler Inhale 2 puffs into the lungs every 6 (six) hours as needed for wheezing or shortness of breath. 01/30/20  Yes Bobbitt, Sedalia Muta, MD  amoxicillin-clavulanate (AUGMENTIN) 875-125 MG tablet Take 1 tablet by mouth every 12 (twelve) hours for 7 days. 10/15/22 10/22/22 Yes Laurene Footman B, PA-C  azithromycin (ZITHROMAX) 250 MG tablet TAKE 2 TABLETS BY MOUTH TODAY, THEN TAKE 1 TABLET DAILY FOR 4 DAYS   Yes [provider]  benzonatate (TESSALON) 100 MG capsule Take 1 capsule by mouth every 8 (eight) hours.   Yes [provider]  cephALEXin (KEFLEX) 500 MG capsule TAKE 1 CAPSULE 4 TIMES A DAY BY ORAL ROUTE FOR 14 DAYS.   Yes [provider]  citalopram (CELEXA) 20 MG tablet    Yes [provider]  colchicine 0.6 MG tablet Take 1 tablet (0.6 mg total) by mouth 2 (two) times daily. 04/02/22  Yes Copland, Frederico Hamman, MD  diazepam (VALIUM) 10 MG tablet    Yes [provider]  diclofenac (VOLTAREN) 75 MG EC tablet Take 75 mg by mouth 2 (two) times daily. 04/03/22  Yes [provider]  diphenhydrAMINE (BENADRYL) 25 MG tablet Take 50 mg by mouth every 6 (six) hours as needed.   Yes [provider]  EPINEPHrine (  AUVI-Q) 0.3 mg/0.3 mL IJ SOAJ injection Inject 0.3 mLs (0.3 mg total) into the muscle as needed for anaphylaxis. 01/30/20  Yes Bobbitt, Heywood Iles, MD  escitalopram (LEXAPRO) 10 MG tablet TAKE ONE TABLET DAILY AS NEEDED FOR ANXIETY AND DEPRESSION   Yes [provider]  ibuprofen (ADVIL,MOTRIN) 200 MG tablet Take 400 mg by mouth every 6 (six) hours as needed. For pain   Yes [provider]  indomethacin (INDOCIN) 50 MG capsule Take 1 capsule (50 mg total) by mouth 3 (three) times daily with meals. 04/02/22  Yes Copland, Karleen Hampshire, MD  ondansetron (ZOFRAN) 8 MG tablet Take 1 tablet every 8  hours by oral route as needed for nausea for 5 days. 03/08/21  Yes [provider]  ondansetron (ZOFRAN-ODT) 4 MG disintegrating tablet Take 1 tablet (4 mg total) by mouth every 6 (six) hours as needed for up to 3 days for nausea or vomiting. 10/15/22 10/18/22 Yes Eusebio Friendly B, PA-C  polyethylene glycol (MIRALAX / GLYCOLAX) 17 g packet INSTILL 3 DROPS INTO EACH EYE 4 TIMES DAILY   Yes [provider]  predniSONE (DELTASONE) 20 MG tablet Take 2 tablets daily for 5 days, then 1 tablet daily for 5 days 03/27/22  Yes Copland, Karleen Hampshire, MD  sulfamethoxazole-trimethoprim (BACTRIM DS) 800-160 MG tablet    Yes [provider]    Family History Family History  Problem Relation Age of Onset   Kidney Stones Father    Hyperlipidemia Father    Gout Father    Thyroid disease Sister    Cancer Maternal Aunt    Thyroid disease Maternal Aunt    Heart disease Maternal Grandfather    Stroke Maternal Grandfather    Kidney Stones Maternal Grandfather    Heart disease Paternal Grandfather    Kidney Stones Paternal Grandfather    Gout Paternal Grandfather    Leukemia Paternal Grandfather    Hyperlipidemia Mother    Gout Paternal Grandmother     Social History Social History   Tobacco Use   Smoking status: Never   Smokeless tobacco: Never  Vaping Use   Vaping Use: Never used  Substance Use Topics   Alcohol use: Yes    Comment: Occationally - once or twice monthly   Drug use: No     Allergies   Bee venom   Review of Systems Review of Systems  Constitutional:  Positive for chills, fatigue and fever.  HENT:  Positive for congestion, rhinorrhea, sinus pressure and sore throat. Negative for sinus pain.   Respiratory:  Positive for cough. Negative for shortness of breath.   Gastrointestinal:  Positive for nausea. Negative for abdominal pain, diarrhea and vomiting.  Musculoskeletal:  Positive for myalgias.  Neurological:  Negative for weakness, light-headedness and  headaches.  Hematological:  Negative for adenopathy.     Physical Exam Triage Vital Signs ED Triage Vitals [10/15/22 0816]  Enc Vitals Group     BP      Pulse      Resp      Temp      Temp src      SpO2      Weight 195 lb (88.5 kg)     Height 5\' 8"  (1.727 m)     Head Circumference      Peak Flow      Pain Score 1     Pain Loc      Pain Edu?      Excl. in [place]?    No data found.  Updated Vital Signs BP 118/72 (BP Location: Left Arm)   Pulse (!) 101   Temp (!) 97.2 F (36.2 C) (Oral)   Resp 16   Ht 5\' 8"  (1.727 m)   Wt 195 lb (88.5 kg)   SpO2 99%   BMI 29.65 kg/m      Physical Exam Vitals and nursing note reviewed.  Constitutional:      General: He is not in acute distress.    Appearance: Normal appearance. He is well-developed. He is not ill-appearing.  HENT:     Head: Normocephalic and atraumatic.     Nose: Congestion present.     Mouth/Throat:     Mouth: Mucous membranes are moist.     Pharynx: Oropharynx is clear. Posterior oropharyngeal erythema present.  Eyes:     Conjunctiva/sclera: Conjunctivae normal.  Cardiovascular:     Rate and Rhythm: Regular rhythm. Tachycardia present.     Heart sounds: Normal heart sounds.  Pulmonary:     Effort: Pulmonary effort is normal. No respiratory distress.     Breath sounds: Normal breath sounds.  Musculoskeletal:     Cervical back: Neck supple.  Skin:    General: Skin is warm and dry.     Capillary Refill: Capillary refill takes less than 2 seconds.  Neurological:     General: No focal deficit present.     Mental Status: He is alert. Mental status is at baseline.     Motor: No weakness.     Gait: Gait normal.  Psychiatric:        Mood and Affect: Mood normal.        Behavior: Behavior normal.      UC Treatments / Results  Labs (all labs ordered are listed, but only abnormal results are displayed) Labs Reviewed  GROUP A STREP BY PCR    EKG   Radiology No results found.  Procedures Procedures  (including critical care time)  Medications Ordered in UC Medications - No data to display  Initial Impression / Assessment and Plan / UC Course  I have reviewed the triage vital signs and the nursing notes.  Pertinent labs & imaging results that were available during my care of the patient were reviewed by me and considered in my medical decision making (see chart for details).   36 year old male presents for fever, fatigue, nausea, sore throat, chills and bodyaches that began 2 days ago.  Cough and congestion x 1 month.  Exposed to strep x 3 by his children.  Vitals are stable.  He is presently afebrile.  He is overall well-appearing and in no acute distress.  On exam he has minimal nasal congestion.  Mild erythema posterior pharynx.  Chest clear to auscultation.  PCR strep test obtained.  Negative.  Discussed with patient.  High suspicion for secondary bacterial illness such as sinusitis or possibly influenza.  No confirmed exposure to influenza and we are currently out of flu test to test patient which I discussed with him.  Will treat at this time for possible secondary bacterial illness with Augmentin.  Also prescribe Zofran.  Advised plenty rest and fluids.  Reviewed returning if he is continue to have fever in the next few days or if he develops any chest pain, shortness of breath or other worsening of symptoms.   Final Clinical Impressions(s) / UC Diagnoses   Final diagnoses:  Acute sinusitis, recurrence not specified, unspecified location  Sore throat  Nausea  Fever, unspecified     Discharge Instructions      -  Negative strep test but given the new fever and the fact even over a month I suspect potentially have a bacterial infection Zosyn antibiotics. - We also discussed the possibility of the flu but I am unable to test you since you are out of test.  Care is supportive for that anyway. - I sent Zofran too.  Continue to treat your fever with Tylenol and Motrin.  You  should be feeling better in the next couple of days.     ED Prescriptions     Medication Sig Dispense Auth. Provider   ondansetron (ZOFRAN-ODT) 4 MG disintegrating tablet Take 1 tablet (4 mg total) by mouth every 6 (six) hours as needed for up to 3 days for nausea or vomiting. 12 tablet Laurene Footman B, PA-C   amoxicillin-clavulanate (AUGMENTIN) 875-125 MG tablet Take 1 tablet by mouth every 12 (twelve) hours for 7 days. 14 tablet Gretta Cool      PDMP not reviewed this encounter.   Danton Clap, PA-C 10/15/22 [phone]

## 2022-10-17 ENCOUNTER — Encounter: Payer: No Typology Code available for payment source | Admitting: Primary Care

## 2022-10-21 ENCOUNTER — Encounter: Payer: Self-pay | Admitting: Primary Care

## 2022-10-21 ENCOUNTER — Ambulatory Visit (INDEPENDENT_AMBULATORY_CARE_PROVIDER_SITE_OTHER): Payer: No Typology Code available for payment source | Admitting: Primary Care

## 2022-10-21 ENCOUNTER — Telehealth: Payer: Self-pay

## 2022-10-21 VITALS — BP 108/60 | HR 75 | Temp 97.3°F | Ht 68.0 in | Wt 188.0 lb

## 2022-10-21 DIAGNOSIS — Z0000 Encounter for general adult medical examination without abnormal findings: Secondary | ICD-10-CM | POA: Diagnosis not present

## 2022-10-21 DIAGNOSIS — F5102 Adjustment insomnia: Secondary | ICD-10-CM

## 2022-10-21 DIAGNOSIS — M2550 Pain in unspecified joint: Secondary | ICD-10-CM | POA: Diagnosis not present

## 2022-10-21 DIAGNOSIS — F411 Generalized anxiety disorder: Secondary | ICD-10-CM

## 2022-10-21 DIAGNOSIS — G8929 Other chronic pain: Secondary | ICD-10-CM | POA: Insufficient documentation

## 2022-10-21 DIAGNOSIS — F330 Major depressive disorder, recurrent, mild: Secondary | ICD-10-CM

## 2022-10-21 DIAGNOSIS — Z87898 Personal history of other specified conditions: Secondary | ICD-10-CM

## 2022-10-21 DIAGNOSIS — M1A9XX0 Chronic gout, unspecified, without tophus (tophi): Secondary | ICD-10-CM | POA: Insufficient documentation

## 2022-10-21 DIAGNOSIS — M1A09X0 Idiopathic chronic gout, multiple sites, without tophus (tophi): Secondary | ICD-10-CM

## 2022-10-21 DIAGNOSIS — J4520 Mild intermittent asthma, uncomplicated: Secondary | ICD-10-CM

## 2022-10-21 DIAGNOSIS — R519 Headache, unspecified: Secondary | ICD-10-CM

## 2022-10-21 DIAGNOSIS — Z91038 Other insect allergy status: Secondary | ICD-10-CM

## 2022-10-21 DIAGNOSIS — E785 Hyperlipidemia, unspecified: Secondary | ICD-10-CM | POA: Diagnosis not present

## 2022-10-21 LAB — COMPREHENSIVE METABOLIC PANEL
ALT: 58 U/L — ABNORMAL HIGH (ref 0–53)
AST: 29 U/L (ref 0–37)
Albumin: 4.5 g/dL (ref 3.5–5.2)
Alkaline Phosphatase: 63 U/L (ref 39–117)
BUN: 15 mg/dL (ref 6–23)
CO2: 26 mEq/L (ref 19–32)
Calcium: 9.8 mg/dL (ref 8.4–10.5)
Chloride: 104 mEq/L (ref 96–112)
Creatinine, Ser: 0.81 mg/dL (ref 0.40–1.50)
GFR: 114.9 mL/min (ref >60.00)
Glucose, Bld: 89 mg/dL (ref 70–99)
Potassium: 4.7 mEq/L (ref 3.5–5.1)
Sodium: 138 mEq/L (ref 135–145)
Total Bilirubin: 0.5 mg/dL (ref 0.2–1.2)
Total Protein: 7.1 g/dL (ref 6.0–8.3)

## 2022-10-21 LAB — CBC
HCT: 42 % (ref 39.0–52.0)
Hemoglobin: 14.3 g/dL (ref 13.0–17.0)
MCHC: 34.1 g/dL (ref 30.0–36.0)
MCV: 84.3 fl (ref 78.0–100.0)
Platelets: 322 10*3/uL (ref 150.0–400.0)
RBC: 4.98 Mil/uL (ref 4.22–5.81)
RDW: 13.9 % (ref 11.5–15.5)
WBC: 4.5 10*3/uL (ref 4.0–10.5)

## 2022-10-21 LAB — LIPID PANEL
Cholesterol: 187 mg/dL (ref 0–200)
HDL: 39.3 mg/dL (ref >39.00)
LDL Cholesterol: 117 mg/dL — ABNORMAL HIGH (ref 0–99)
NonHDL: 147.53
Total CHOL/HDL Ratio: 5
Triglycerides: 151 mg/dL — ABNORMAL HIGH (ref 0.0–149.0)
VLDL: 30.2 mg/dL (ref 0.0–40.0)

## 2022-10-21 LAB — URIC ACID: Uric Acid, Serum: 7.8 mg/dL (ref 4.0–7.8)

## 2022-10-21 MED ORDER — ALBUTEROL SULFATE HFA 108 (90 BASE) MCG/ACT IN AERS: 2.0000 | INHALATION_SPRAY | Freq: Four times a day (QID) | RESPIRATORY_TRACT | 0 refills | Status: DC | PRN

## 2022-10-21 MED ORDER — ONDANSETRON 4 MG PO TBDP: 4.0000 mg | ORAL_TABLET | Freq: Three times a day (TID) | ORAL | 0 refills | Status: DC | PRN

## 2022-10-21 MED ORDER — EPINEPHRINE 0.3 MG/0.3ML IJ SOAJ: 0.3000 mg | INTRAMUSCULAR | 0 refills | Status: AC | PRN

## 2022-10-21 NOTE — Progress Notes (Signed)
Subjective:    Patient ID: Clifford Hall, male    DOB: Jan 23, 1986, 37 y.o.   MRN: [id number]  HPI  Clifford Hall is a very pleasant 37 y.o. male who presents today for complete physical and follow up of chronic conditions.  Immunizations: -Tetanus: 2018 -Influenza: Declines today  Diet: Improved diet.  Exercise: No regular exercise. Active at work.   Eye exam: Completed a few years ago. Dental exam: Completes semi-annually    BP Readings from Last 3 Encounters:  10/21/22 108/60  10/15/22 118/72  07/15/22 117/71       Review of Systems  Constitutional:  Negative for unexpected weight change.  HENT:  Negative for rhinorrhea.   Eyes:  Negative for visual disturbance.  Respiratory:  Negative for cough and shortness of breath.   Cardiovascular:  Negative for chest pain.  Gastrointestinal:  Negative for constipation and diarrhea.  Genitourinary:  Negative for difficulty urinating.  Musculoskeletal:  Positive for arthralgias and myalgias.  Skin:  Negative for rash.  Allergic/Immunologic: Negative for environmental allergies.  Neurological:  Positive for headaches. Negative for dizziness.  Psychiatric/Behavioral:  The patient is nervous/anxious.          Past Medical History:  Diagnosis Date   Asthma    Dyspnea/wheezing 04/15/2016   History of asthma    History of chickenpox    History of headache    Hymenoptera allergy    Poison ivy dermatitis 03/30/2020    Social History   Socioeconomic History   Marital status: Married    Spouse name: Not on file   Number of children: Not on file   Years of education: Not on file   Highest education level: Not on file  Occupational History   Not on file  Tobacco Use   Smoking status: Never   Smokeless tobacco: Never  Vaping Use   Vaping Use: Never used  Substance and Sexual Activity   Alcohol use: Yes    Comment: Occationally - once or twice monthly   Drug use: No   Sexual activity:  Not on file  Other Topics Concern   Not on file  Social History Narrative   Married.   3 children, 1 child on the way.   Works in [employer].    Enjoys playing video games, spending time with family.   Social Determinants of Health   Financial Resource Strain: Not on file  Food Insecurity: Not on file  Transportation Needs: Not on file  Physical Activity: Not on file  Stress: Not on file  Social Connections: Not on file  Intimate Partner Violence: Not on file    Past Surgical History:  Procedure Laterality Date   I & D EXTREMITY  09/28/2012   Procedure: IRRIGATION AND DEBRIDEMENT EXTREMITY;  Surgeon: Roseanne Kaufman, MD;  Location: [facility];  Service: Orthopedics;  Laterality: Left;   NERVE AND TENDON REPAIR  09/28/2012   Procedure: NERVE AND TENDON REPAIR;  Surgeon: Roseanne Kaufman, MD;  Location: [facility];  Service: Orthopedics;  Laterality: Left;   WISDOM TOOTH EXTRACTION  2008    Family History  Problem Relation Age of Onset   Hyperlipidemia Mother    Kidney Stones Father    Hyperlipidemia Father    Gout Father    Heart attack Father    Thyroid disease Sister    Heart disease Maternal Grandfather    Stroke Maternal Grandfather    Kidney Stones Maternal Grandfather    Gout Paternal Grandmother  Heart disease Paternal Grandfather    Kidney Stones Paternal Grandfather    Gout Paternal Grandfather    Leukemia Paternal Grandfather    Cancer Maternal Aunt    Thyroid disease Maternal Aunt     Allergies  Allergen Reactions   Bee Venom Anaphylaxis    Current Outpatient Medications on File Prior to Visit  Medication Sig Dispense Refill   colchicine 0.6 MG tablet Take 1 tablet (0.6 mg total) by mouth 2 (two) times daily. 50 tablet 2   ibuprofen (ADVIL,MOTRIN) 200 MG tablet Take 400 mg by mouth every 6 (six) hours as needed. For pain     polyethylene glycol (MIRALAX / GLYCOLAX) 17 g packet INSTILL 3 DROPS INTO EACH EYE 4 TIMES DAILY     indomethacin (INDOCIN) 50 MG  capsule Take 1 capsule (50 mg total) by mouth 3 (three) times daily with meals. (Patient not taking: Reported on 10/21/2022) 50 capsule 2   No current facility-administered medications on file prior to visit.    BP 108/60   Pulse 75   Temp (!) 97.3 F (36.3 C) (Temporal)   Ht 5\' 8"  (1.727 m)   Wt 188 lb (85.3 kg)   SpO2 99%   BMI 28.59 kg/m  Objective:   Physical Exam HENT:     Right Ear: Tympanic membrane and ear canal normal.     Left Ear: Tympanic membrane and ear canal normal.     Nose: Nose normal.     Right Sinus: No maxillary sinus tenderness or frontal sinus tenderness.     Left Sinus: No maxillary sinus tenderness or frontal sinus tenderness.  Eyes:     Conjunctiva/sclera: Conjunctivae normal.  Neck:     Thyroid: No thyromegaly.     Vascular: No carotid bruit.  Cardiovascular:     Rate and Rhythm: Normal rate and regular rhythm.     Heart sounds: Normal heart sounds.  Pulmonary:     Effort: Pulmonary effort is normal.     Breath sounds: Normal breath sounds. No wheezing or rales.  Abdominal:     General: Bowel sounds are normal.     Palpations: Abdomen is soft.     Tenderness: There is no abdominal tenderness.  Musculoskeletal:        General: Normal range of motion.     Cervical back: Neck supple.  Skin:    General: Skin is warm and dry.  Neurological:     Mental Status: He is alert and oriented to person, place, and time.     Cranial Nerves: No cranial nerve deficit.     Deep Tendon Reflexes: Reflexes are normal and symmetric.  Psychiatric:        Mood and Affect: Mood normal.           Assessment & Plan:   Problem List Items Addressed This Visit       Respiratory   Mild intermittent asthma/exercise-induced bronchospasm    Controlled. Exercise induced.  Continue albuterol inhaler PRN.      Relevant Medications   albuterol (VENTOLIN HFA) 108 (90 Base) MCG/ACT inhaler     Other   Hymenoptera venom allergy    No recent use of Epi  Pen. Refill provided today.      Relevant Medications   EPINEPHrine (AUVI-Q) 0.3 mg/0.3 mL IJ SOAJ injection   History of motion sickness    Continue Zofran ODT 4 mg PRN.      Relevant Medications   ondansetron (ZOFRAN-ODT) 4 MG disintegrating tablet  Preventative health care - Primary    Immunizations UTD. Declines influenza vaccine today.  Discussed the importance of a healthy diet and regular exercise in order for weight loss, and to reduce the risk of further co-morbidity.  Exam stable. Labs pending.  Follow up in 1 year for repeat physical.       Insomnia due to stress    Improving with magnesium supplements.  Continue to monitor.  No longer on SSRI treatment per his request.       Hyperlipidemia    Repeat lipid panel pending.        Relevant Medications   EPINEPHrine (AUVI-Q) 0.3 mg/0.3 mL IJ SOAJ injection   Other Relevant Orders   Lipid panel   Comprehensive metabolic panel   CBC   MDD (major depressive disorder)    Overall controlled per patient. Managing with self calming techniques.   Remain off Lexapro 10 mg for now.  Continue to monitor.       Relevant Medications   albuterol (VENTOLIN HFA) 108 (90 Base) MCG/ACT inhaler   GAD (generalized anxiety disorder)    Overall controlled per patient. Managing with self calming techniques.   Remain off Lexapro 10 mg for now.  Continue to monitor.       Frequent headaches    Multifactorial in cause.  Improving with magnesium supplement.  Continue to monitor.       Chronic gout    Infrequent outbreaks.   Continue colchicine 0.6 mg PRN, indomethacin 50 mg TID PRN.  Discussed triggers for gout.   Repeat uric acid level pending.       Relevant Orders   Uric acid   Chronic joint pain    Generalized.  Physically active job.   Discussed to avoid recurrent use of Ibuprofen. Discussed stretching.           Doreene Nest, NP

## 2022-10-21 NOTE — Assessment & Plan Note (Signed)
Overall controlled per patient. Managing with self calming techniques.   Remain off Lexapro 10 mg for now.  Continue to monitor.

## 2022-10-21 NOTE — Assessment & Plan Note (Signed)
Generalized.  Physically active job.   Discussed to avoid recurrent use of Ibuprofen. Discussed stretching.

## 2022-10-21 NOTE — Assessment & Plan Note (Signed)
Infrequent outbreaks.   Continue colchicine 0.6 mg PRN, indomethacin 50 mg TID PRN.  Discussed triggers for gout.   Repeat uric acid level pending.

## 2022-10-21 NOTE — Patient Instructions (Addendum)
Stop by the lab prior to leaving today. I will notify you of your results once received.   It was a pleasure to see you today!  Low-Purine Eating Plan A low-purine eating plan involves making food choices to limit your purine intake. Purine is a kind of uric acid. Too much uric acid in your blood can cause certain conditions, such as gout and kidney stones. Eating a low-purine diet may help control these conditions. What are tips for following this plan? Shopping Avoid buying products that contain high-fructose corn syrup. Check for this on food labels. It is commonly found in many processed foods and soft drinks. Be sure to check for it in baked goods such as cookies, canned fruits, and cereals and cereal bars. Avoid buying veal, chicken breast with skin, lamb, and organ meats such as liver. These types of meats tend to have the highest purine content. Choose dairy products. These may lower uric acid levels. Avoid certain types of fish. Not all fish and seafood have high purine content. Examples with high purine content include anchovies, trout, tuna, sardines, and salmon. Avoid buying beverages that contain alcohol, particularly beer and hard liquor. Alcohol can affect the way your body gets rid of uric acid. Meal planning  Learn which foods do or do not affect you. If you find out that a food tends to cause your gout symptoms to flare up, avoid eating that food. You can enjoy foods that do not cause problems. If you have any questions about a food item, talk with your dietitian or health care provider. Reduce the overall amount of meat in your diet. When you do eat meat, choose ones with lower purine content. Include plenty of fruits and vegetables. Although some vegetables may have a high purine content--such as asparagus, mushrooms, spinach, or cauliflower--it has been shown that these do not contribute to uric acid blood levels as much. Consume at least 1 dairy serving a day. This has been  shown to decrease uric acid levels. General information If you drink alcohol: Limit how much you have to: 0-1 drink a day for women who are not pregnant. 0-2 drinks a day for men. Know how much alcohol is in a drink. In the [place] one drink equals one 12 oz bottle of beer (355 mL), one 5 oz glass of wine (148 mL), or one 1 oz glass of hard liquor (44 mL). Drink plenty of water. Try to drink enough to keep your urine pale yellow. Fluids can help remove uric acid from your body. Work with your health care provider and dietitian to develop a plan to achieve or maintain a healthy weight. Losing weight may help reduce uric acid in your blood. What foods are recommended? The following are some types of foods that are good choices when limiting purine intake: Fresh or frozen fruits and vegetables. Whole grains, breads, cereals, and pasta. Rice. Beans, peas, legumes. Nuts and seeds. Dairy products. Fats and oils. The items listed above may not be a complete list. Talk with a dietitian about what dietary choices are best for you. What foods are not recommended? Limit your intake of foods high in purines, including: Beer and other alcohol. Meat-based gravy or sauce. Canned or fresh fish, such as: Anchovies, sardines, herring, salmon, and tuna. Mussels and scallops. Codfish, trout, and haddock. Bacon, veal, chicken breast with skin, and lamb. Organ meats, such as: Liver or kidney. Tripe. Sweetbreads (thymus gland or pancreas). Wild Clinical biochemist. Yeast or yeast extract supplements.  Drinks sweetened with high-fructose corn syrup, such as soda. Processed foods made with high-fructose corn syrup. The items listed above may not be a complete list of foods and beverages you should limit. Contact a dietitian for more information. Summary Eating a low-purine diet may help control conditions caused by too much uric acid in the body, such as gout or kidney stones. Choose low-purine foods, limit  alcohol, and limit high-fructose corn syrup. You will learn over time which foods do or do not affect you. If you find out that a food tends to cause your gout symptoms to flare up, avoid eating that food. This information is not intended to replace advice given to you by your health care provider. Make sure you discuss any questions you have with your health care provider. Document Revised: 09/19/2021 Document Reviewed: 09/19/2021 Elsevier Patient Education  [place].

## 2022-10-21 NOTE — Assessment & Plan Note (Signed)
Improving with magnesium supplements.  Continue to monitor.  No longer on SSRI treatment per his request.

## 2022-10-21 NOTE — Assessment & Plan Note (Signed)
Controlled. Exercise induced.  Continue albuterol inhaler PRN.

## 2022-10-21 NOTE — Assessment & Plan Note (Signed)
Continue Zofran ODT 4 mg PRN.

## 2022-10-21 NOTE — Assessment & Plan Note (Signed)
No recent use of Epi Pen. Refill provided today.

## 2022-10-21 NOTE — Assessment & Plan Note (Signed)
Multifactorial in cause.  Improving with magnesium supplement.  Continue to monitor.

## 2022-10-21 NOTE — Assessment & Plan Note (Signed)
Immunizations UTD. Declines influenza vaccine today.  Discussed the importance of a healthy diet and regular exercise in order for weight loss, and to reduce the risk of further co-morbidity.  Exam stable. Labs pending.  Follow up in 1 year for repeat physical.

## 2022-10-21 NOTE — Assessment & Plan Note (Signed)
Repeat lipid panel pending. 

## 2022-10-21 NOTE — Telephone Encounter (Signed)
[facility] Night - Client Nonclinical Telephone Record  [facility] Client [facility] Night - Client Client Site [place] - Night Provider Alma Friendly - NP Contact Type Call Who Is Calling Patient / Member / Family / Caregiver Caller Name Clifford Hall Caller Phone Number [phone] Call Type Message Only Information Provided Reason for Call Returning a Call from the Office Initial [place] stated that they are returning a call from the office. Disp. Time Disposition Final User 10/20/2022 2:06:13 PM General Information Provided Yes McGehee, April Call Closed By: April McGehee Transaction Date/Time: 10/20/2022 2:04:59 PM (ET  Per chart review tab pt has already had office visit today.sending note to [place] pool.

## 2022-10-21 NOTE — Assessment & Plan Note (Signed)
Overall controlled per patient. Managing with self calming techniques.   Remain off Lexapro 10 mg for now.  Continue to monitor.  

## 2023-01-22 ENCOUNTER — Encounter (INDEPENDENT_AMBULATORY_CARE_PROVIDER_SITE_OTHER): Payer: Self-pay | Admitting: Urgent Care

## 2023-01-22 ENCOUNTER — Telehealth: Payer: Self-pay | Admitting: Urgent Care

## 2023-01-22 DIAGNOSIS — H60332 Swimmer's ear, left ear: Secondary | ICD-10-CM

## 2023-01-22 DIAGNOSIS — H66002 Acute suppurative otitis media without spontaneous rupture of ear drum, left ear: Secondary | ICD-10-CM

## 2023-01-22 MED ORDER — AMOXICILLIN 875 MG PO TABS: 875.0000 mg | ORAL_TABLET | Freq: Two times a day (BID) | ORAL | 0 refills | Status: AC

## 2023-01-22 MED ORDER — CIPROFLOXACIN-DEXAMETHASONE 0.3-0.1 % OT SUSP: 4.0000 [drp] | Freq: Two times a day (BID) | OTIC | 0 refills | Status: AC

## 2023-01-22 NOTE — Progress Notes (Signed)
E Visit for Ear Pain - Swimmer's Ear  We are sorry that you are not feeling well. Here is how we plan to help!  Based on what you have shared with me it looks like you have Swimmer's Ear.  Swimmer's ear is a redness or swelling, irritation, or infection of your outer ear canal. These symptoms usually occur within a few days of swimming. Your ear canal is a tube that goes from the opening of the ear to the eardrum.  When water stays in your ear canal, germs can grow.  This is a painful condition that often happens to children and swimmers of all ages.  It is not contagious and oral antibiotics are not required to treat uncomplicated swimmer's ear.  The usual symptoms include:    Itchiness inside the ear  Redness or a sense of swelling in the ear  Pain when the ear is tugged on when pressure is placed on the ear  Pus draining from the infected ear   I have prescribed Amoxicillin 875 mg one tablet twice daily for 10 days  I have prescribed: Ciprofloxin 0.3% and dexamethasone 0.1% otic suspension 4 drops in affected ears twice daily for 7 days  In certain cases, swimmer's ear may progress to a more serious bacterial infection of the middle or inner ear.  If you have a fever 102 and up and significantly worsening symptoms, this could indicate a more serious infection moving to the middle/inner and needs face to face evaluation in an office by a provider.  Your symptoms should improve over the next 3 days and should resolve in about 7 days.  Be sure to complete ALL of your prescription.  HOME CARE: Wash your hands frequently. If you are prescribed an ear drop, do not place the tip of the bottle on your ear or touch it with your fingers. You can take Acetaminophen 650 mg every 4-6 hours as needed for pain.  If pain is severe or moderate, you can apply a heating pad (set on low) or hot water bottle (wrapped in a towel) to outer ear for 20 minutes.  This will also increase drainage. Avoid ear  plugs Do not go swimming until the symptoms are gone Do not use Q-tips After showers, help the water run out by tilting your head to one side.   GET HELP RIGHT AWAY IF: Fever is over 102.2 degrees. You develop progressive ear pain or hearing loss. Ear symptoms persist longer than 3 days after treatment.  MAKE SURE YOU: Understand these instructions. Will watch your condition. Will get help right away if you are not doing well or get worse.  TO PREVENT SWIMMER'S EAR: Use a bathing cap or custom fitted swim molds to keep your ears dry. Towel off after swimming to dry your ears. Tilt your head or pull your earlobes to allow the water to escape your ear canal. If there is still water in your ears, consider using a hairdryer on the lowest setting.  Thank you for choosing an e-visit.  Your e-visit answers were reviewed by a board certified advanced clinical practitioner to complete your personal care plan. Depending upon the condition, your plan could have included both over the counter or prescription medications.  Please review your pharmacy choice. Make sure the pharmacy is open so you can pick up the prescription now. If there is a problem, you may contact your provider through [employer] and have the prescription routed to another pharmacy.  Your safety is  important to [place]. If you have drug allergies check your prescription carefully.   For the next 24 hours you can use MyChart to ask questions about today's visit, request a non-urgent call back, or ask for a work or school excuse. You will get an email with a survey after your eVisit asking about your experience. We would appreciate your feedback. I hope that your e-visit has been valuable and will aid in your recovery.   I have spent 5 minutes in review of e-visit questionnaire, review and updating patient chart, medical decision making and response to patient.   Devlon Dosher L Lorrinda Ramstad, PA

## 2023-01-27 ENCOUNTER — Other Ambulatory Visit: Payer: Self-pay | Admitting: Family Medicine

## 2023-01-27 DIAGNOSIS — M1A09X0 Idiopathic chronic gout, multiple sites, without tophus (tophi): Secondary | ICD-10-CM

## 2023-03-21 ENCOUNTER — Other Ambulatory Visit: Payer: Self-pay | Admitting: Primary Care

## 2023-03-21 DIAGNOSIS — M1A09X0 Idiopathic chronic gout, multiple sites, without tophus (tophi): Secondary | ICD-10-CM

## 2023-04-14 ENCOUNTER — Encounter: Payer: Self-pay | Admitting: Primary Care

## 2023-04-14 ENCOUNTER — Telehealth (INDEPENDENT_AMBULATORY_CARE_PROVIDER_SITE_OTHER): Payer: No Typology Code available for payment source | Admitting: Primary Care

## 2023-04-14 DIAGNOSIS — F411 Generalized anxiety disorder: Secondary | ICD-10-CM | POA: Diagnosis not present

## 2023-04-14 DIAGNOSIS — F330 Major depressive disorder, recurrent, mild: Secondary | ICD-10-CM

## 2023-04-14 NOTE — Patient Instructions (Signed)
You will either be contacted via phone regarding your referral to therapy, or you may receive a letter on your MyChart portal from our referral team with instructions for scheduling an appointment. Please let us know if you have not been contacted by anyone within two weeks.  It was a pleasure to see you today!
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
## 2023-04-14 NOTE — Assessment & Plan Note (Signed)
Deteriorated.   Discussed options for treatment.  We both agree that therapy would be a great start, especially to help provide tools to manage his symptoms and his wife's panic attacks.  Referral placed.

## 2023-04-14 NOTE — Assessment & Plan Note (Signed)
Deteriorated.   Discussed options for treatment.  We both agree that therapy would be a great start, especially to help provide tools to manage his symptoms and his wife's panic attacks.  Referral placed.  

## 2023-05-20 ENCOUNTER — Ambulatory Visit
Admission: EM | Admit: 2023-05-20 | Discharge: 2023-05-20 | Disposition: A | Discharge status: Home / Self Care | Payer: No Typology Code available for payment source | Attending: Family Medicine | Admitting: Family Medicine

## 2023-05-20 DIAGNOSIS — K429 Umbilical hernia without obstruction or gangrene: Secondary | ICD-10-CM

## 2023-05-20 DIAGNOSIS — R1033 Periumbilical pain: Secondary | ICD-10-CM | POA: Diagnosis not present

## 2023-05-20 DIAGNOSIS — K5904 Chronic idiopathic constipation: Secondary | ICD-10-CM

## 2023-05-20 NOTE — Discharge Instructions (Addendum)
I suspect you have an umbilical hernia or an abdominal tendon injury.  See handout on umbilical hernia. Schedule your follow up appointment with your primary care provider to discuss a CT of your abdomen.  Go to the emergency department/ED,  worsening abdominal pain, vomiting, fever.  Try not to lift anything more than 30 pounds.    Regarding constipation:  - Eat small meals frequently and increased fluid intake. -Gradually increase in fiber intake with dried fruits, vegetables with skins, beans, whole grains and cereal.  -Goal 35g of fiber per day.  - Drink hot beverages and prune juice; add probiotic-containing foods like pasteurized yogurt and kefir - Increasing physical activity is a great way to relief constipation

## 2023-05-20 NOTE — ED Triage Notes (Signed)
Patient states that he went to work yesterday and lifted something heavy. States that he had a knot above his belly button and felt something pop when he pushed it back in. He also fell in the shower a month ago.

## 2023-05-20 NOTE — ED Provider Notes (Signed)
[facility]    CSN: [id number] Arrival date & time: 05/20/23  0272      History   Chief Complaint Chief Complaint  Patient presents with   Abdominal Injury    HPI Clifford Hall is a 37 y.o. male.   HPI  Clifford Hall presents for abdominal pain.  Reports lifting at work yesterday. He went to shower yesterday and felt firm belly.  Had 6 bowel movements yesterday. Notes that he had to strain and has chronic constipation.   He pushes around his belly button and felt a pop and then had pain "shoot to his spine". He lifts 40-50 lbs regularly and even 100 lbs at times. He tries to lifts with his legs and reports practicing good body mechanics. He previously saw a chiropractor who taught him how to lift. Had some nausea but this was after reading on [employer] which scared him so much that he came to the doctor today.  No current nausea. There has been no vomiting or additional abdominal pain episodes.  Sokha has otherwise been well and has no other concerns.      Past Medical History:  Diagnosis Date   Asthma    Dyspnea/wheezing 04/15/2016   History of asthma    History of chickenpox    History of headache    Hymenoptera allergy    Poison ivy dermatitis 03/30/2020    Patient Active Problem List   Diagnosis Date Noted   Chronic gout 10/21/2022   Chronic joint pain 10/21/2022   Frequent headaches 04/09/2021   Hyperlipidemia 02/29/2020   MDD (major depressive disorder) 02/29/2020   GAD (generalized anxiety disorder) 02/29/2020   Insomnia due to stress 07/04/2019   Fatigue 07/04/2019   Preventative health care 11/23/2018   Mild intermittent asthma/exercise-induced bronchospasm 11/03/2018   History of motion sickness 11/03/2018   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 Authorizing Provider  colchicine 0.6 MG tablet 2 TABS AT GOUT ONSET AND 1 TAB 2 HRS LATER. THEN 1 TAB TWICE DAILY THEREAFTER UNTIL FLARE RESOLVES 03/22/23  Yes Doreene Nest, NP  ibuprofen (ADVIL,MOTRIN) 200 MG tablet Take 400 mg by mouth every 6 (six) hours as needed. For pain   Yes [provider]  albuterol (VENTOLIN HFA) 108 (90 Base) MCG/ACT inhaler Inhale 2 puffs into the lungs every 6 (six) hours as needed for wheezing or shortness of breath. 10/21/22   Doreene Nest, NP  EPINEPHrine (AUVI-Q) 0.3 mg/0.3 mL IJ SOAJ injection Inject 0.3 mg into the muscle as needed for anaphylaxis. 10/21/22   Doreene Nest, NP  indomethacin (INDOCIN) 50 MG capsule Take 1 capsule (50 mg total) by mouth 3 (three) times daily with meals. 04/02/22   Copland, Karleen Hampshire, MD  polyethylene glycol (MIRALAX / GLYCOLAX) 17 g packet INSTILL 3 DROPS INTO EACH EYE 4 TIMES DAILY    [provider]    Family History Family History  Problem Relation Age of Onset   Hyperlipidemia Mother    Kidney Stones Father    Hyperlipidemia Father    Gout Father    Heart attack Father    Thyroid disease Sister    Heart disease Maternal Grandfather    Stroke Maternal Grandfather    Kidney Stones Maternal Grandfather    Gout Paternal Grandmother    Heart disease Paternal Grandfather    Kidney Stones Paternal Grandfather    Gout Paternal Grandfather    Leukemia Paternal Grandfather    Cancer Maternal Aunt    Thyroid disease Maternal Aunt     Social History Social History   Tobacco Use   Smoking status: Never   Smokeless tobacco: Never  Vaping Use   Vaping  status: Never Used  Substance Use Topics   Alcohol use: Yes    Comment: Occationally - once or twice monthly   Drug use: No     Allergies   Bee venom   Review of Systems Review of Systems :negative unless otherwise stated in HPI.      Physical Exam Triage Vital Signs ED Triage Vitals  Encounter Vitals Group     BP 05/20/23 0826 126/78     Systolic BP Percentile --      Diastolic BP Percentile --      Pulse Rate 05/20/23 0826 63     Resp 05/20/23 0826 16     Temp 05/20/23 0826 98.2 F (36.8 C)     Temp Source 05/20/23 0826 Oral     SpO2 05/20/23 0826 98 %     Weight 05/20/23 0825 190 lb (86.2 kg)     Height --      Head Circumference --      Peak Flow --      Pain Score --      Pain Loc --      Pain Education --      Exclude from Growth Chart --    No data found.  Updated Vital Signs BP 126/78 (BP Location: Right Arm)   Pulse 63   Temp 98.2 F (36.8 C) (Oral)   Resp 16   Wt 86.2 kg   SpO2 98%   BMI 28.89 kg/m   Visual Acuity Right Eye Distance:   Left Eye Distance:   Bilateral Distance:    Right Eye Near:   Left Eye Near:    Bilateral Near:     Physical Exam  GEN: pleasant well appearing male, in no acute distress  RESP: no increased work of breathing ABD: Soft, non-tender, non-distended.  No guarding, no rebound, no appreciable hepatosplenomegaly, no CVA tenderness, abdominal wall defect at the anterior belly button, approximately 1-1.5 cm SKIN: warm, dry, no rash on visible skin NEURO: alert, moves all extremities appropriately  UC Treatments / Results  Labs (all labs ordered are listed, but only abnormal results are displayed) Labs Reviewed - No data to display  EKG  If EKG performed, see my interpretation and MDM section  Radiology No results found.   Procedures Procedures (including critical care time)  Medications Ordered in UC Medications - No data to display  Initial Impression / Assessment and Plan / UC Course  I have  reviewed the triage vital signs and the nursing notes.  Pertinent labs & imaging results that were available during my care of the patient were reviewed by me and considered in my medical decision making (see chart for details).       Patient is  a  37 y.o. malewith history constipation who presents after having insidious abdominal pain last night.  Overall, patient is well-appearing, well-hydrated, and in no acute distress.  Vital signs stable.  Matthewis afebrile.  Exam is not concerning for an acute abdomen. He has approx 1-1.5 cm defect to just anterior periumbilical wall concerning for umbilical hernia vs rectus sheath tear/defect.   Area was palpated on accident while in the shower last night and it sounds like he had bowel protrusion at that time.  I do not think he need emergent abdominal imaging at this time.  Labs deferred.  Pt does lift often at work therefore abdominal hernia favored over rectus abdominis defect.     Acute on chronic constipation. Pt straining to have bowel movements. Has been eating lots of blueberries. Reports stools are smooth on the bistol scale.  Reviewed constipation clean out and maintenance with patient. Discussed eating small meals frequently and increased fluid intake, as well as gradual increase in fiber intake with dried fruits, vegetables with skins, beans, whole grains and cereal. Goal 35g of fiber per day. Encouraged drinking hot beverages and prune juice; add probiotic-containing foods like pasteurized yogurt and kefir; encourage increased physical activity.     Follow-up, return and ED precautions given. Discussed MDM, treatment plan and plan for follow-up with patient who agrees with plan.    Final Clinical Impressions(s) / UC Diagnoses   Final diagnoses:  Periumbilical abdominal pain  Chronic idiopathic constipation  Reducible umbilical hernia     Discharge Instructions      I suspect you have an umbilical hernia or an abdominal tendon  injury.  See handout on umbilical hernia. Schedule your follow up appointment with your primary care provider to discuss a CT of your abdomen.  Go to the emergency department/ED,  worsening abdominal pain, vomiting, fever.  Try not to lift anything more than 30 pounds.    Regarding constipation:  - Eat small meals frequently and increased fluid intake. -Gradually increase in fiber intake with dried fruits, vegetables with skins, beans, whole grains and cereal.  -Goal 35g of fiber per day.  - Drink hot beverages and prune juice; add probiotic-containing foods like pasteurized yogurt and kefir - Increasing physical activity is a great way to relief constipation      ED Prescriptions   None    PDMP not reviewed this encounter.   Katha Cabal, DO 05/20/23 [phone]

## 2023-05-21 ENCOUNTER — Ambulatory Visit (INDEPENDENT_AMBULATORY_CARE_PROVIDER_SITE_OTHER): Payer: Worker's Compensation | Admitting: Primary Care

## 2023-05-21 ENCOUNTER — Encounter: Payer: Self-pay | Admitting: Primary Care

## 2023-05-21 VITALS — BP 106/74 | HR 75 | Temp 98.4°F | Ht 68.0 in | Wt 192.0 lb

## 2023-05-21 DIAGNOSIS — R1033 Periumbilical pain: Secondary | ICD-10-CM | POA: Diagnosis not present

## 2023-05-21 DIAGNOSIS — R109 Unspecified abdominal pain: Secondary | ICD-10-CM | POA: Insufficient documentation

## 2023-05-21 NOTE — Patient Instructions (Signed)
You should receive a phone call from the imaging center regarding your CT scan.  Please notify me if you have not heard from anyone by early next week.  Avoid heavy lifting as discussed  It was a pleasure to see you today!

## 2023-05-21 NOTE — Progress Notes (Signed)
Subjective:    Patient ID: Clifford Hall, male    DOB: 09/05/1986, 37 y.o.   MRN: [id number]  HPI  Farzad Avitabile is a very pleasant 37 y.o. male with a history of frequent headaches, chronic joint pain, fatigue, asthma who presents today to discuss abdominal pain.  He presented to [facility] urgent care yesterday for a 1 day history of umbilical abdominal pain.  He works lifting 40 to 50 pound objects regularly, sometimes 100 pound objects at times.  Prior to symptom onset he was at work lifting objects per usual.  Later that day he was showering and noticed firmness to his belly with 6 bowel movements.  He pushed around his abdomen and felt a pop and had a sudden "shoot to his spine".  During his exam he was noted to have a 1 to 1.5 cm defect to periumbilical wall which was suspicious for umbilical hernia versus rectus sheath tear.  He was discharged with recommendations to follow-up with PCP for CT scan.  Today he mentions that he's reduced the area of bulge about 6 times. He does notice mild soreness around the umbilical region. He denies fevers. He has been reducing his heavy lifting, is not lifting anything over 30 pounds. He has been dealing with constipation for the last few months. He has increased his dietary intake of fiber which has helped.    Review of Systems  Constitutional:  Negative for fever.  Gastrointestinal:  Positive for abdominal pain. Negative for constipation, nausea and vomiting.         Past Medical History:  Diagnosis Date   Asthma    Dyspnea/wheezing 04/15/2016   History of asthma    History of chickenpox    History of headache    Hymenoptera allergy    Poison ivy dermatitis 03/30/2020    Social History   Socioeconomic History   Marital status: Married    Spouse name: Not on file   Number of children: Not on file   Years of education: Not on file   Highest education level: Bachelor's degree (e.g., BA, AB, BS)  Occupational  History   Not on file  Tobacco Use   Smoking status: Never   Smokeless tobacco: Never  Vaping Use   Vaping status: Never Used  Substance and Sexual Activity   Alcohol use: Yes    Comment: Occationally - once or twice monthly   Drug use: No   Sexual activity: Not on file  Other Topics Concern   Not on file  Social History Narrative   Married.   3 children, 1 child on the way.   Works in Radiation protection practitioner.    Enjoys playing video games, spending time with family.   Social Determinants of Health   Financial Resource Strain: Low Risk  (05/20/2023)   Overall Financial Resource Strain (CARDIA)    Difficulty of Paying Living Expenses: Not very hard  Food Insecurity: No Food Insecurity (05/20/2023)   Hunger Vital Sign    Worried About Running Out of Food in the Last Year: Never true    Ran Out of Food in the Last Year: Never true  Transportation Needs: No Transportation Needs (05/20/2023)   PRAPARE - Administrator, Civil Service (Medical): No    Lack of Transportation (Non-Medical): No  Physical Activity: Sufficiently Active (05/20/2023)   Exercise Vital Sign    Days of Exercise per Week: 6 days    Minutes of Exercise per Session: 30  min  Stress: Stress Concern Present (05/20/2023)   [employer] of Occupational Health - Occupational Stress Questionnaire    Feeling of Stress : Very much  Social Connections: Moderately Integrated (05/20/2023)   Social Connection and Isolation Panel [NHANES]    Frequency of Communication with Friends and Family: More than three times a week    Frequency of Social Gatherings with Friends and Family: Patient declined    Attends Religious Services: 1 to 4 times per year    Active Member of [employer] or Organizations: No    Attends Engineer, structural: Not on file    Marital Status: Married  Catering manager Violence: Not on file    Past Surgical History:  Procedure Laterality Date   I & D EXTREMITY  09/28/2012   Procedure:  IRRIGATION AND DEBRIDEMENT EXTREMITY;  Surgeon: Dominica Severin, MD;  Location: [facility] OR;  Service: Orthopedics;  Laterality: Left;   NERVE AND TENDON REPAIR  09/28/2012   Procedure: NERVE AND TENDON REPAIR;  Surgeon: Dominica Severin, MD;  Location: [facility] OR;  Service: Orthopedics;  Laterality: Left;   WISDOM TOOTH EXTRACTION  2008    Family History  Problem Relation Age of Onset   Hyperlipidemia Mother    Kidney Stones Father    Hyperlipidemia Father    Gout Father    Heart attack Father 91   Thyroid disease Sister    Heart disease Maternal Grandfather    Stroke Maternal Grandfather    Kidney Stones Maternal Grandfather    Gout Paternal Grandmother    Heart disease Paternal Grandfather    Kidney Stones Paternal Grandfather    Gout Paternal Grandfather    Leukemia Paternal Grandfather    Heart attack Paternal Grandfather    Cancer Maternal Aunt    Thyroid disease Maternal Aunt     Allergies  Allergen Reactions   Bee Venom Anaphylaxis    Current Outpatient Medications on File Prior to Visit  Medication Sig Dispense Refill   colchicine 0.6 MG tablet 2 TABS AT GOUT ONSET AND 1 TAB 2 HRS LATER. THEN 1 TAB TWICE DAILY THEREAFTER UNTIL FLARE RESOLVES 60 tablet 0   EPINEPHrine (AUVI-Q) 0.3 mg/0.3 mL IJ SOAJ injection Inject 0.3 mg into the muscle as needed for anaphylaxis. 1 each 0   ibuprofen (ADVIL,MOTRIN) 200 MG tablet Take 400 mg by mouth every 6 (six) hours as needed. For pain     polyethylene glycol (MIRALAX / GLYCOLAX) 17 g packet INSTILL 3 DROPS INTO EACH EYE 4 TIMES DAILY     albuterol (VENTOLIN HFA) 108 (90 Base) MCG/ACT inhaler Inhale 2 puffs into the lungs every 6 (six) hours as needed for wheezing or shortness of breath. (Patient not taking: Reported on 05/21/2023) 18 g 0   indomethacin (INDOCIN) 50 MG capsule Take 1 capsule (50 mg total) by mouth 3 (three) times daily with meals. (Patient not taking: Reported on 05/21/2023) 50 capsule 2   No current facility-administered  medications on file prior to visit.    BP 106/74   Pulse 75   Temp 98.4 F (36.9 C) (Temporal)   Ht 5\' 8"  (1.727 m)   Wt 192 lb (87.1 kg)   SpO2 99%   BMI 29.19 kg/m  Objective:   Physical Exam Cardiovascular:     Rate and Rhythm: Normal rate and regular rhythm.  Pulmonary:     Effort: Pulmonary effort is normal.  Abdominal:     General: Bowel sounds are normal.     Palpations: Abdomen is  soft.       Comments: Mild bulge with tenderness to upper part of umbilicus  Musculoskeletal:     Cervical back: Neck supple.  Skin:    General: Skin is warm and dry.  Neurological:     Mental Status: He is alert and oriented to person, place, and time.           Assessment & Plan:  Periumbilical abdominal pain Assessment & Plan: Exam and HPI suspicious for umbilical hernia. Unclear to the extent of which.  CT abdomen and pelvis ordered and pending. He is stable for outpatient treatment.  No apparent complications noted on exam.  Reviewed urgent care notes from yesterday.  Avoid heavy lifting, discussed with patient today.  Orders: -     CT ABDOMEN PELVIS W CONTRAST; Future        Doreene Nest, NP

## 2023-05-21 NOTE — Assessment & Plan Note (Addendum)
Exam and HPI suspicious for umbilical hernia. Unclear to the extent of which.  CT abdomen and pelvis ordered and pending. He is stable for outpatient treatment.  No apparent complications noted on exam.  Reviewed urgent care notes from yesterday.  Avoid heavy lifting, discussed with patient today.

## 2023-05-22 ENCOUNTER — Ambulatory Visit: Payer: No Typology Code available for payment source | Admitting: Clinical

## 2023-05-22 ENCOUNTER — Encounter: Payer: Self-pay | Admitting: *Deleted

## 2023-06-02 ENCOUNTER — Ambulatory Visit
Admission: RE | Admit: 2023-06-02 | Discharge: 2023-06-02 | Disposition: A | Discharge status: Home / Self Care | Payer: Worker's Compensation | Source: Ambulatory Visit | Attending: Primary Care | Admitting: Primary Care

## 2023-06-02 DIAGNOSIS — R1033 Periumbilical pain: Secondary | ICD-10-CM | POA: Insufficient documentation

## 2023-06-02 DIAGNOSIS — K439 Ventral hernia without obstruction or gangrene: Secondary | ICD-10-CM

## 2023-06-02 DIAGNOSIS — K4090 Unilateral inguinal hernia, without obstruction or gangrene, not specified as recurrent: Secondary | ICD-10-CM

## 2023-06-02 MED ORDER — IOHEXOL 300 MG/ML  SOLN
100.0000 mL | Freq: Once | INTRAMUSCULAR | Status: AC | PRN
  Administered 2023-06-02: 100 mL via INTRAVENOUS

## 2023-07-03 ENCOUNTER — Ambulatory Visit (INDEPENDENT_AMBULATORY_CARE_PROVIDER_SITE_OTHER): Payer: Self-pay | Admitting: Surgery

## 2023-07-03 ENCOUNTER — Encounter: Payer: Self-pay | Admitting: Surgery

## 2023-07-03 VITALS — BP 121/81 | HR 65 | Temp 98.5°F | Ht 68.0 in | Wt 194.0 lb

## 2023-07-03 DIAGNOSIS — K429 Umbilical hernia without obstruction or gangrene: Secondary | ICD-10-CM

## 2023-07-03 DIAGNOSIS — K4090 Unilateral inguinal hernia, without obstruction or gangrene, not specified as recurrent: Secondary | ICD-10-CM

## 2023-07-03 NOTE — Progress Notes (Signed)
07/03/2023  Reason for Visit:  Umbilical hernia, right inguinal hernia  Requesting Provider:  Vernona Rieger, NP  History of Present Illness: Clifford Hall is a 37 y.o. male presenting for evaluation of an umbilical hernia.  The patient reports that he does heavy lifting at work.  About 2 months ago, he noticed an area of tightness and firmness in the umbilical area followed by pain that was shooting to the back of his spine.  He reports feeling a bulging and popping sensation.  After pushing the hernia back in, he felt relief.  He saw his PCP on 05/21/2023 and a CT scan of the abdomen pelvis was done on 06/02/2023 which showed a fat-containing umbilical hernia and incidentally also a right inguinal hernia also containing fat.  The patient has not had any issues in the right groin.  Denies currently any worsening pain.  However there is some discomfort with the hernia bulging.  He has been changing some of his activities at work and trying to be smart about how he lifts equipment.  He denies any nausea or vomiting, constipation, or difficulty voiding.  Past Medical History: Past Medical History:  Diagnosis Date   Asthma    Dyspnea/wheezing 04/15/2016   History of asthma    History of chickenpox    History of headache    Hymenoptera allergy    Poison ivy dermatitis 03/30/2020     Past Surgical History: Past Surgical History:  Procedure Laterality Date   I & D EXTREMITY  09/28/2012   Procedure: IRRIGATION AND DEBRIDEMENT EXTREMITY;  Surgeon: Dominica Severin, MD;  Location: [facility] OR;  Service: Orthopedics;  Laterality: Left;   NERVE AND TENDON REPAIR  09/28/2012   Procedure: NERVE AND TENDON REPAIR;  Surgeon: Dominica Severin, MD;  Location: [facility] OR;  Service: Orthopedics;  Laterality: Left;   WISDOM TOOTH EXTRACTION  2008    Home Medications: Prior to Admission medications   Medication Sig Start Date End Date Taking? Authorizing Provider  colchicine 0.6 MG tablet 2 TABS AT GOUT  ONSET AND 1 TAB 2 HRS LATER. THEN 1 TAB TWICE DAILY THEREAFTER UNTIL FLARE RESOLVES 03/22/23  Yes Doreene Nest, NP  EPINEPHrine (AUVI-Q) 0.3 mg/0.3 mL IJ SOAJ injection Inject 0.3 mg into the muscle as needed for anaphylaxis. 10/21/22  Yes Doreene Nest, NP  ibuprofen (ADVIL,MOTRIN) 200 MG tablet Take 400 mg by mouth every 6 (six) hours as needed. For pain   Yes [provider]    Allergies: Allergies  Allergen Reactions   Bee Venom Anaphylaxis    Social History:  reports that he has never smoked. He has never been exposed to tobacco smoke. He has never used smokeless tobacco. He reports current alcohol use. He reports that he does not use drugs.   Family History: Family History  Problem Relation Age of Onset   Hyperlipidemia Mother    Kidney Stones Father    Hyperlipidemia Father    Gout Father    Heart attack Father 67   Thyroid disease Sister    Heart disease Maternal Grandfather    Stroke Maternal Grandfather    Kidney Stones Maternal Grandfather    Gout Paternal Grandmother    Heart disease Paternal Grandfather    Kidney Stones Paternal Grandfather    Gout Paternal Grandfather    Leukemia Paternal Grandfather    Heart attack Paternal Grandfather    Cancer Maternal Aunt    Thyroid disease Maternal Aunt     Review of Systems:  Review of Systems  Constitutional:  Negative for chills and fever.  Respiratory:  Negative for shortness of breath.   Cardiovascular:  Negative for chest pain.  Gastrointestinal:  Positive for abdominal pain. Negative for nausea and vomiting.  Genitourinary:  Negative for dysuria.    Physical Exam BP 121/81   Pulse 65   Temp 98.5 F (36.9 C)   Ht 5\' 8"  (1.727 m)   Wt 194 lb (88 kg)   SpO2 97%   BMI 29.50 kg/m  CONSTITUTIONAL: No acute distress, well-nourished HEENT:  Normocephalic, atraumatic, extraocular motion intact.  RESPIRATORY:  Normal respiratory effort without pathologic use of accessory  muscles. CARDIOVASCULAR: Regular rhythm and rate  GI: The abdomen is soft, nondistended, with mild discomfort to palpation at the umbilicus.  The patient has a reducible umbilical hernia.  On exam, I am unable to fully discern a right inguinal hernia despite of him coughing and straining I do not feel an area of bulging.  No palpable hernia in the left groin.  MUSCULOSKELETAL:  Normal muscle strength and tone in all four extremities.  No peripheral edema or cyanosis. NEUROLOGIC:  Motor and sensation is grossly normal.  Cranial nerves are grossly intact. PSYCH:  Alert and oriented to person, place and time. Affect is normal.  Laboratory Analysis: Labs from 10/21/2022: Sodium 138, potassium 4.7, chloride 104, CO2 26, BUN 15, creatinine 0.81.  WBC 4.5, hemoglobin 14.3, hematocrit 42, platelets 322.  Imaging: CT abdomen/pelvis on 06/02/2023: IMPRESSION: 1. Small fat containing right inguinal hernia. 2. Small fat containing supraumbilical ventral hernia. 3. Punctate nonobstructive right renal stones measure up to 2-3 mm.  Assessment and Plan: This is a 37 y.o. male with an umbilical hernia and possibly a right inguinal hernia.  - Discussed with patient the findings on the CT scan.  CT scan does show an umbilical hernia which I am able to palpate and also shows a right inguinal hernia which I am unable to palpate well today.  Discussed with patient that the hernia may be small enough that it does not bulge out as easily hence not causing him any significant symptoms at this point.  However, the umbilical hernia is well palpable and remains reducible today. - Discussed with patient that unfortunately is no medical treatment to fix the hernia and exercises, medications, or dietary changes would not repair of the hernia.  Discussed with him that surgery is the only way to repair the hernias.  For now, the patient is interested in getting more information about the hernias and what can happen if nothing is  done.  Discussed with him that the natural progression for hernias is to although slowly get bigger with time and potentially progress with symptoms.  Discussed with him that for now that his symptoms are not as significant, we do have some flexibility in timing of repair. - The patient will discuss with his wife and at work also to see what would be the best time to proceed with surgery.  He understands the postoperative restrictions of no heavy lifting or pushing of no more than 15 pounds for period of 6 weeks given his line of work.  He is thinking potentially early next year would be a better timeframe.  He will call us when he has made a decision.  I discussed with him that depending on how further in the future she is planning on surgery, we will have to do a follow-up visit to discuss surgery more and for H&P update. -  He understands this plan and all of his questions have been answered.  I spent 30 minutes dedicated to the care of this patient on the date of this encounter to include pre-visit review of records, face-to-face time with the patient discussing diagnosis and management, and any post-visit coordination of care.   Howie Ill, MD [facility]

## 2023-07-03 NOTE — Patient Instructions (Addendum)
We have spoken today about having an Umbilical & Inguinal Hernia Repair. This will be done by Dr Aleen Campi at [facility] Please call us once you are ready to have this surgery completed. Depending on how long it has been since we have seen you, you may need to come in to be seen by the surgeon.   You may have a bruise in your groin and also swelling and brusing in your testicle area. You may use ice 4-5 times daily for 15-20 minutes each time. Make sure that you place a barrier between you and the ice pack. To decrease the swelling, you may roll up a bath towel and place it vertically in between your thighs with your testicles resting on the towel. You will want to keep this area elevated as much as possible for several days following surgery.  You will need to arrange to be out of work for approximately 1-2 weeks and then you may return with a lifting restriction for 4 more weeks. If you have FMLA or Disability paperwork that needs to be filled out, please have your company fax your paperwork to [phone] or you may drop this by either office. This paperwork will be filled out within 3 days after your surgery has been completed.       Umbilical Hernia, Adult A hernia is a bulge of tissue that pushes through an opening between muscles. An umbilical hernia happens in the abdomen, near the belly button (umbilicus). The hernia may contain tissues from the small intestine, large intestine, or fatty tissue covering the intestines (omentum). Umbilical hernias in adults tend to get worse over time, and they require surgical treatment. There are several types of umbilical hernias. You may have: A hernia located just above or below the umbilicus (indirect hernia). This is the most common type of umbilical hernia in adults. A hernia that forms through an opening formed by the umbilicus (direct hernia). A hernia that comes and goes (reducible hernia). A reducible hernia may be visible only when you strain, lift  something heavy, or cough. This type of hernia can be pushed back into the abdomen (reduced). A hernia that traps abdominal tissue inside the hernia (incarcerated hernia). This type of hernia cannot be reduced. A hernia that cuts off blood flow to the tissues inside the hernia (strangulated hernia). The tissues can start to die if this happens. This type of hernia requires emergency treatment.  What are the causes? An umbilical hernia happens when tissue inside the abdomen presses on a weak area of the abdominal muscles. What increases the risk? You may have a greater risk of this condition if you: Are obese. Have had several pregnancies. Have a buildup of fluid inside your abdomen (ascites). Have had surgery that weakens the abdominal muscles.  What are the signs or symptoms? The main symptom of this condition is a painless bulge at or near the belly button. A reducible hernia may be visible only when you strain, lift something heavy, or cough. Other symptoms may include: Dull pain. A feeling of pressure.  Symptoms of a strangulated hernia may include: Pain that gets increasingly worse. Nausea and vomiting. Pain when pressing on the hernia. Skin over the hernia becoming red or purple. Constipation. Blood in the stool.  How is this diagnosed? This condition may be diagnosed based on: A physical exam. You may be asked to cough or strain while standing. These actions increase the pressure inside your abdomen and force the hernia through the opening in  your muscles. Your health care provider may try to reduce the hernia by pressing on it. Your symptoms and medical history.  How is this treated? Surgery is the only treatment for an umbilical hernia. Surgery for a strangulated hernia is done as soon as possible. If you have a small hernia that is not incarcerated, you may need to lose weight before having surgery. Follow these instructions at home: Lose weight, if told by your health  care provider. Do not try to push the hernia back in. Watch your hernia for any changes in color or size. Tell your health care provider if any changes occur. You may need to avoid activities that increase pressure on your hernia. Do not lift anything that is heavier than 10 lb (4.5 kg) until your health care provider says that this is safe. Take over-the-counter and prescription medicines only as told by your health care provider. Keep all follow-up visits as told by your health care provider. This is important. Contact a health care provider if: Your hernia gets larger. Your hernia becomes painful. Get help right away if: You develop sudden, severe pain near the area of your hernia. You have pain as well as nausea or vomiting. You have pain and the skin over your hernia changes color. You develop a fever. This information is not intended to replace advice given to you by your health care provider. Make sure you discuss any questions you have with your health care provider. Document Released: 03/07/2016 Document Revised: 06/08/2016 Document Reviewed: 03/07/2016 Elsevier Interactive Patient Education  [employer].

## 2023-11-30 ENCOUNTER — Other Ambulatory Visit: Payer: Self-pay | Admitting: Primary Care

## 2023-11-30 DIAGNOSIS — M1A09X0 Idiopathic chronic gout, multiple sites, without tophus (tophi): Secondary | ICD-10-CM

## 2023-11-30 MED ORDER — COLCHICINE 0.6 MG PO TABS: ORAL_TABLET | ORAL | 0 refills | Status: DC

## 2023-12-11 ENCOUNTER — Encounter: Payer: No Typology Code available for payment source | Admitting: Primary Care

## 2023-12-13 ENCOUNTER — Other Ambulatory Visit: Payer: Self-pay | Admitting: Primary Care

## 2023-12-13 DIAGNOSIS — M1A09X0 Idiopathic chronic gout, multiple sites, without tophus (tophi): Secondary | ICD-10-CM

## 2024-01-08 ENCOUNTER — Encounter: Admitting: Primary Care

## 2024-01-26 ENCOUNTER — Encounter: Payer: Self-pay | Admitting: Primary Care

## 2024-01-26 ENCOUNTER — Ambulatory Visit (INDEPENDENT_AMBULATORY_CARE_PROVIDER_SITE_OTHER): Admitting: Primary Care

## 2024-01-26 VITALS — BP 110/62 | HR 70 | Temp 98.1°F | Ht 68.0 in | Wt 200.0 lb

## 2024-01-26 DIAGNOSIS — M1A9XX0 Chronic gout, unspecified, without tophus (tophi): Secondary | ICD-10-CM | POA: Diagnosis not present

## 2024-01-26 DIAGNOSIS — Z114 Encounter for screening for human immunodeficiency virus [HIV]: Secondary | ICD-10-CM | POA: Diagnosis not present

## 2024-01-26 DIAGNOSIS — Z1159 Encounter for screening for other viral diseases: Secondary | ICD-10-CM

## 2024-01-26 MED ORDER — PREDNISONE 20 MG PO TABS: ORAL_TABLET | ORAL | 0 refills | Status: DC

## 2024-01-26 MED ORDER — ALLOPURINOL 100 MG PO TABS: 100.0000 mg | ORAL_TABLET | Freq: Every day | ORAL | 0 refills | Status: DC

## 2024-01-26 NOTE — Assessment & Plan Note (Signed)
 Controlled.  Continue to monitor.

## 2024-01-26 NOTE — Patient Instructions (Signed)
 Start prednisone medicine for gout flare. Take 3 tablets by mouth every morning x 3 days, then take 2 tablets for 3 days, then take 1 tablet for 3 days.  Start allopurinol 100 mg once daily once gout flare completely resolves.  Stop by the lab prior to leaving today. I will notify you of your results once received.   It was a pleasure to see you today!

## 2024-01-26 NOTE — Assessment & Plan Note (Signed)
 Uncontrolled with acute flare.   Treat acute flare with prednisone 60 mg x 3 days, then 40 mg x 3 days, 20 mg x 3 days. Start allopurinol 100 mg daily once gout flare has resolved.  Uric acid level pending today, repeat at next visit in May 2025.

## 2024-01-26 NOTE — Progress Notes (Signed)
 Subjective:    Patient ID: Clifford Hall, male    DOB: November 09, 1985, 38 y.o.   MRN: [id number]  HPI  Clifford Hall is a very pleasant 38 y.o. male with a history of chronic gout who presents today for acute gout flare.  Symptom onset yesterday morning at 2 am with pain and swelling to his right great metatarsal joint. He took 2 colchicine tabs, then later that day took another one. Since then has been taking the colchicine twice daily, took an extra dose without improvement.   He notices gout symptoms on a mild level once every other week. He was working on his diet for several months, increased fruit and made healthier eating choices. When on a healthier diet he has symptoms about 1 time monthly. His major gout flares occur once monthly.   While taking colchicine he experiences side effects of dizziness, severe diarrhea, and nausea. Last month he had to take 2 tabs twice daily to reduce symptoms. Over the last few weeks he's been eating more fried food and take out food. He has prescriptions for diclofenac and indomethacin which do not help. He has a family history of gout in father.  BP Readings from Last 3 Encounters:  01/26/24 110/62  07/03/23 121/81  05/21/23 106/74        Review of Systems  Gastrointestinal:  Positive for diarrhea.  Musculoskeletal:  Positive for arthralgias and joint swelling.  Skin:  Negative for color change.  Neurological:  Positive for dizziness.         Past Medical History:  Diagnosis Date   Asthma    Dyspnea/wheezing 04/15/2016   History of asthma    History of chickenpox    History of headache    Hymenoptera allergy    Poison ivy dermatitis 03/30/2020    Social History   Socioeconomic History   Marital status: Married    Spouse name: Not on file   Number of children: Not on file   Years of education: Not on file   Highest education level: Bachelor's degree (e.g., BA, AB, BS)  Occupational History   Not on  file  Tobacco Use   Smoking status: Never    Passive exposure: Never   Smokeless tobacco: Never  Vaping Use   Vaping status: Never Used  Substance and Sexual Activity   Alcohol use: Yes    Comment: Occationally - once or twice monthly   Drug use: No   Sexual activity: Not on file  Other Topics Concern   Not on file  Social History Narrative   Married.   3 children, 1 child on the way.   Works in Radiation protection practitioner.    Enjoys playing video games, spending time with family.   Social Drivers of Corporate investment banker Strain: Low Risk  (01/26/2024)   Overall Financial Resource Strain (CARDIA)    Difficulty of Paying Living Expenses: Not very hard  Food Insecurity: No Food Insecurity (01/26/2024)   Hunger Vital Sign    Worried About Running Out of Food in the Last Year: Never true    Ran Out of Food in the Last Year: Never true  Transportation Needs: No Transportation Needs (01/26/2024)   PRAPARE - Administrator, Civil Service (Medical): No    Lack of Transportation (Non-Medical): No  Physical Activity: Insufficiently Active (01/26/2024)   Exercise Vital Sign    Days of Exercise per Week: 6 days    Minutes of Exercise  per Session: 20 min  Stress: Stress Concern Present (01/26/2024)   [employer] of Occupational Health - Occupational Stress Questionnaire    Feeling of Stress : Rather much  Social Connections: Moderately Integrated (01/26/2024)   Social Connection and Isolation Panel [NHANES]    Frequency of Communication with Friends and Family: More than three times a week    Frequency of Social Gatherings with Friends and Family: Patient declined    Attends Religious Services: 1 to 4 times per year    Active Member of [employer] or Organizations: No    Attends Engineer, structural: Not on file    Marital Status: Married  Catering manager Violence: Not on file    Past Surgical History:  Procedure Laterality Date   I & D EXTREMITY  09/28/2012   Procedure:  IRRIGATION AND DEBRIDEMENT EXTREMITY;  Surgeon: Dominica Severin, MD;  Location: [facility] OR;  Service: Orthopedics;  Laterality: Left;   NERVE AND TENDON REPAIR  09/28/2012   Procedure: NERVE AND TENDON REPAIR;  Surgeon: Dominica Severin, MD;  Location: [facility] OR;  Service: Orthopedics;  Laterality: Left;   WISDOM TOOTH EXTRACTION  2008    Family History  Problem Relation Age of Onset   Hyperlipidemia Mother    Kidney Stones Father    Hyperlipidemia Father    Gout Father    Heart attack Father 33   Thyroid disease Sister    Heart disease Maternal Grandfather    Stroke Maternal Grandfather    Kidney Stones Maternal Grandfather    Gout Paternal Grandmother    Heart disease Paternal Grandfather    Kidney Stones Paternal Grandfather    Gout Paternal Grandfather    Leukemia Paternal Grandfather    Heart attack Paternal Grandfather    Cancer Maternal Aunt    Thyroid disease Maternal Aunt     Allergies  Allergen Reactions   Bee Venom Anaphylaxis    Current Outpatient Medications on File Prior to Visit  Medication Sig Dispense Refill   colchicine 0.6 MG tablet 2 TABS AT GOUT ONSET AND 1 TAB 2 HRS LATER. THEN 1 TAB TWICE DAILY THEREAFTER UNTIL FLARE RESOLVES 30 tablet 0   EPINEPHrine (AUVI-Q) 0.3 mg/0.3 mL IJ SOAJ injection Inject 0.3 mg into the muscle as needed for anaphylaxis. 1 each 0   ibuprofen (ADVIL,MOTRIN) 200 MG tablet Take 400 mg by mouth every 6 (six) hours as needed. For pain     ondansetron (ZOFRAN) 8 MG tablet Take by mouth every 8 (eight) hours as needed for nausea or vomiting.     No current facility-administered medications on file prior to visit.    BP 110/62   Pulse 70   Temp 98.1 F (36.7 C) (Oral)   Ht 5\' 8"  (1.727 m)   Wt 200 lb (90.7 kg)   SpO2 100%   BMI 30.41 kg/m  Objective:   Physical Exam Cardiovascular:     Rate and Rhythm: Normal rate.  Pulmonary:     Effort: Pulmonary effort is normal.  Musculoskeletal:     Cervical back: Neck supple.        Feet:     Comments: Right great metatarsal joint swelling and mild erythema.   Skin:    General: Skin is warm and dry.  Neurological:     Mental Status: He is alert and oriented to person, place, and time.     Deep Tendon Reflexes:     Reflex Scores:      Patellar reflexes are 2+ on the  right side and 2+ on the left side.          Assessment & Plan:  Chronic gout involving toe of right foot without tophus, unspecified cause Assessment & Plan: Uncontrolled with acute flare.   Treat acute flare with prednisone 60 mg x 3 days, then 40 mg x 3 days, 20 mg x 3 days. Start allopurinol 100 mg daily once gout flare has resolved.  Uric acid level pending today, repeat at next visit in May 2025.  Orders: -     Uric acid -     Allopurinol; Take 1 tablet (100 mg total) by mouth daily. For gout prevention  Dispense: 90 tablet; Refill: 0 -     predniSONE; Take 3 tablets by mouth every morning x 3 days, then take 2 tablets for 3 days, then take 1 tablet for 3 days.  Dispense: 18 tablet; Refill: 0  Encounter for hepatitis C screening test for low risk patient -     Hepatitis C antibody  Encounter for screening for HIV -     HIV Antibody (routine testing w rflx)        Doreene Nest, NP

## 2024-01-27 DIAGNOSIS — M1A9XX0 Chronic gout, unspecified, without tophus (tophi): Secondary | ICD-10-CM

## 2024-01-27 LAB — HIV ANTIBODY (ROUTINE TESTING W REFLEX): HIV 1&2 Ab, 4th Generation: NONREACTIVE

## 2024-01-27 LAB — HEPATITIS C ANTIBODY: Hepatitis C Ab: NONREACTIVE

## 2024-01-27 LAB — URIC ACID: Uric Acid, Serum: 9.9 mg/dL — ABNORMAL HIGH (ref 4.0–7.8)

## 2024-02-08 ENCOUNTER — Ambulatory Visit: Payer: Self-pay

## 2024-02-08 NOTE — Telephone Encounter (Signed)
 Copied from CRM [id number]. Topic: Clinical - Red Word Triage >> Feb 08, 2024  2:10 PM Clifford Hall wrote: Red Word that prompted transfer to Nurse Triage: Can barely walk due to Gout. Painful (severe). Would like to know what Dr. Fulton Job would like him to do.  Chief Complaint: gout in right big toe and food Symptoms: pain Frequency: constant Pertinent Negatives: Patient denies fever, numbness Disposition: [] ED /[] Urgent Care (no appt availability in office) / [] Appointment(In office/virtual)/ []  [facility]/ [x] Home Care/ [] Refused Recommended Disposition /[] [facility]/ []  Follow-up with PCP Additional Notes: Wants to know what he should do next; declined apt as was seen recently for same issue.  Care advice given, denies questions; instructed to go to ER if becomes worse. Office to call him back.   Reason for Disposition  Pain in the big toe joint  Answer Assessment - Initial Assessment Questions 1. ONSET: "When did the pain start?"      Two weeks ago. Dx gout 2. LOCATION: "Where is the pain located?"      Right pad of foot and right big toe 3. PAIN: "How bad is the pain?"    (Scale 1-10; or mild, moderate, severe)  - MILD (1-3): doesn't interfere with normal activities.   - MODERATE (4-7): interferes with normal activities (e.g., work or school) or awakens from sleep, limping.   - SEVERE (8-10): excruciating pain, unable to do any normal activities, unable to walk.      severe 4. WORK OR EXERCISE: "Has there been any recent work or exercise that involved this part of the body?"      Denies; hx gout 5. CAUSE: "What do you think is causing the foot pain?"     gout 6. OTHER SYMPTOMS: "Do you have any other symptoms?" (e.g., leg pain, rash, fever, numbness)     pain 7. PREGNANCY: "Is there any chance you are pregnant?" "When was your last menstrual period?"     na  Protocols used: Foot Pain-A-AH

## 2024-02-09 MED ORDER — PREDNISONE 20 MG PO TABS: ORAL_TABLET | ORAL | 0 refills | Status: DC

## 2024-03-04 ENCOUNTER — Telehealth: Payer: Self-pay

## 2024-03-04 NOTE — Telephone Encounter (Signed)
 Copied from CRM [id number]. Topic: Clinical - Request for Lab/Test Order >> Mar 04, 2024 11:59 AM Artemio Larry wrote: Reason for CRM: Patient scheduled lab appointment for 03/18/24 at 8:30am and needs orders put in for labs/physical

## 2024-03-07 ENCOUNTER — Other Ambulatory Visit: Payer: Self-pay | Admitting: Primary Care

## 2024-03-07 DIAGNOSIS — M1A09X0 Idiopathic chronic gout, multiple sites, without tophus (tophi): Secondary | ICD-10-CM

## 2024-03-07 DIAGNOSIS — E785 Hyperlipidemia, unspecified: Secondary | ICD-10-CM

## 2024-03-07 NOTE — Telephone Encounter (Signed)
Noted, orders placed. 

## 2024-03-09 ENCOUNTER — Other Ambulatory Visit: Payer: Self-pay | Admitting: Primary Care

## 2024-03-10 ENCOUNTER — Encounter: Admitting: Primary Care

## 2024-03-15 ENCOUNTER — Encounter: Admitting: Primary Care

## 2024-03-18 ENCOUNTER — Encounter: Payer: Self-pay | Admitting: Primary Care

## 2024-03-18 ENCOUNTER — Ambulatory Visit: Payer: Self-pay | Admitting: Primary Care

## 2024-03-18 ENCOUNTER — Encounter: Admitting: Primary Care

## 2024-03-18 ENCOUNTER — Ambulatory Visit: Admitting: Primary Care

## 2024-03-18 ENCOUNTER — Other Ambulatory Visit

## 2024-03-18 VITALS — BP 122/84 | HR 100 | Temp 98.1°F | Ht 68.0 in | Wt 197.0 lb

## 2024-03-18 DIAGNOSIS — Z0000 Encounter for general adult medical examination without abnormal findings: Secondary | ICD-10-CM

## 2024-03-18 DIAGNOSIS — E785 Hyperlipidemia, unspecified: Secondary | ICD-10-CM

## 2024-03-18 DIAGNOSIS — F411 Generalized anxiety disorder: Secondary | ICD-10-CM

## 2024-03-18 DIAGNOSIS — M1A09X0 Idiopathic chronic gout, multiple sites, without tophus (tophi): Secondary | ICD-10-CM

## 2024-03-18 DIAGNOSIS — K4090 Unilateral inguinal hernia, without obstruction or gangrene, not specified as recurrent: Secondary | ICD-10-CM | POA: Insufficient documentation

## 2024-03-18 DIAGNOSIS — M1A9XX0 Chronic gout, unspecified, without tophus (tophi): Secondary | ICD-10-CM

## 2024-03-18 DIAGNOSIS — Z8249 Family history of ischemic heart disease and other diseases of the circulatory system: Secondary | ICD-10-CM | POA: Diagnosis not present

## 2024-03-18 DIAGNOSIS — J4520 Mild intermittent asthma, uncomplicated: Secondary | ICD-10-CM

## 2024-03-18 DIAGNOSIS — K429 Umbilical hernia without obstruction or gangrene: Secondary | ICD-10-CM

## 2024-03-18 DIAGNOSIS — F330 Major depressive disorder, recurrent, mild: Secondary | ICD-10-CM

## 2024-03-18 LAB — COMPREHENSIVE METABOLIC PANEL WITH GFR
ALT: 37 U/L (ref 0–53)
AST: 19 U/L (ref 0–37)
Albumin: 4.9 g/dL (ref 3.5–5.2)
Alkaline Phosphatase: 62 U/L (ref 39–117)
BUN: 20 mg/dL (ref 6–23)
CO2: 23 meq/L (ref 19–32)
Calcium: 10.1 mg/dL (ref 8.4–10.5)
Chloride: 103 meq/L (ref 96–112)
Creatinine, Ser: 0.92 mg/dL (ref 0.40–1.50)
GFR: 107.34 mL/min (ref >60.00)
Glucose, Bld: 89 mg/dL (ref 70–99)
Potassium: 3.7 meq/L (ref 3.5–5.1)
Sodium: 137 meq/L (ref 135–145)
Total Bilirubin: 0.8 mg/dL (ref 0.2–1.2)
Total Protein: 7.5 g/dL (ref 6.0–8.3)

## 2024-03-18 LAB — LIPID PANEL
Cholesterol: 252 mg/dL — ABNORMAL HIGH (ref 0–200)
HDL: 54.1 mg/dL (ref >39.00)
LDL Cholesterol: 173 mg/dL — ABNORMAL HIGH (ref 0–99)
NonHDL: 197.87
Total CHOL/HDL Ratio: 5
Triglycerides: 126 mg/dL (ref 0.0–149.0)
VLDL: 25.2 mg/dL (ref 0.0–40.0)

## 2024-03-18 LAB — URIC ACID: Uric Acid, Serum: 9.8 mg/dL — ABNORMAL HIGH (ref 4.0–7.8)

## 2024-03-18 MED ORDER — PREDNISONE 20 MG PO TABS: ORAL_TABLET | ORAL | 0 refills | Status: DC

## 2024-03-18 MED ORDER — COLCHICINE 0.6 MG PO TABS: ORAL_TABLET | ORAL | 0 refills | Status: DC

## 2024-03-18 NOTE — Assessment & Plan Note (Signed)
 Uncontrolled with recurrent flares.   Unfortunately, he has been unable to start allopurinol  given recurrent symptoms.  Continue prednisone  60 mg x 3 days, 40 mg x 3 days, 20 mg x 3 days. Continue colchicine  0.6 mg BID.  Start allopurinol  100 mg daily when flare resolves. Uric acid pending.

## 2024-03-18 NOTE — Patient Instructions (Signed)
 Continue prednisone .  Take 60 mg daily x 3 days then 40 mg x 3 days then 20 mg x 3 days.  Continue colchicine  0.6 mg twice daily.  Start the allopurinol  once your flare resolves.  Stop by the lab prior to leaving today. I will notify you of your results once received.   You will receive a phone call regarding the CT scan of the arteries of the heart.  It was a pleasure to see you today!

## 2024-03-18 NOTE — Assessment & Plan Note (Signed)
 Denies concerns today. Continue to monitor.

## 2024-03-18 NOTE — Assessment & Plan Note (Signed)
 Stable.  Planning for surgery in November 2025.

## 2024-03-18 NOTE — Assessment & Plan Note (Signed)
 In father and both grandfathers.  Orders placed for coronary calcium CT score. Lipid panel pending.

## 2024-03-18 NOTE — Assessment & Plan Note (Signed)
Repeat lipid panel pending today. 

## 2024-03-18 NOTE — Assessment & Plan Note (Signed)
Immunizations UTD.  Discussed the importance of a healthy diet and regular exercise in order for weight loss, and to reduce the risk of further co-morbidity.  Exam stable. Labs pending.  Follow up in 1 year for repeat physical.  

## 2024-03-18 NOTE — Assessment & Plan Note (Signed)
 Controlled.  Continue albuterol inhaler PRN

## 2024-03-18 NOTE — Progress Notes (Signed)
 Subjective:    Patient ID: Clifford Hall, male    DOB: 05-18-1986, 38 y.o.   MRN: [id number]  HPI  Clifford Hall is a very pleasant 38 y.o. male who presents today for complete physical and follow up of chronic conditions.  He has noticed a return of his gout symptoms two days ago. Yesterday he began his colchicine  and 60 mg of prednisone  and has noticed improvement. He has yet to begin his allopurinol  due to his lingering symptoms. He does eat fast food and fried food, drinks soda. He believes this has contributed to his symptoms.  He does have a family history of gout in his father. He is needing a refill of his prednisone .   Immunizations: -Tetanus: Completed in 2018  Diet: Fair diet.  Exercise: No regular exercise.  Eye exam: Completes annually  Dental exam: Completes semi-annually     BP Readings from Last 3 Encounters:  03/18/24 122/84  01/26/24 110/62  07/03/23 121/81       Review of Systems  Constitutional:  Negative for unexpected weight change.  HENT:  Negative for rhinorrhea.   Respiratory:  Negative for cough and shortness of breath.   Cardiovascular:  Negative for chest pain.  Gastrointestinal:  Negative for constipation and diarrhea.  Genitourinary:  Negative for difficulty urinating.  Musculoskeletal:  Positive for arthralgias.  Skin:  Positive for color change. Negative for rash.  Allergic/Immunologic: Negative for environmental allergies.  Neurological:  Negative for dizziness and headaches.  Psychiatric/Behavioral:  The patient is not nervous/anxious.          Past Medical History:  Diagnosis Date   Asthma    Dyspnea/wheezing 04/15/2016   History of asthma    History of chickenpox    History of headache    Hymenoptera allergy    Poison ivy dermatitis 03/30/2020    Social History   Socioeconomic History   Marital status: Married    Spouse name: Not on file   Number of children: Not on file   Years of  education: Not on file   Highest education level: Bachelor's degree (e.g., BA, AB, BS)  Occupational History   Not on file  Tobacco Use   Smoking status: Never    Passive exposure: Never   Smokeless tobacco: Never  Vaping Use   Vaping status: Never Used  Substance and Sexual Activity   Alcohol use: Yes    Comment: Occationally - once or twice monthly   Drug use: No   Sexual activity: Not on file  Other Topics Concern   Not on file  Social History Narrative   Married.   3 children, 1 child on the way.   Works in Radiation protection practitioner.    Enjoys playing video games, spending time with family.   Social Drivers of Corporate investment banker Strain: Low Risk  (01/26/2024)   Overall Financial Resource Strain (CARDIA)    Difficulty of Paying Living Expenses: Not very hard  Food Insecurity: No Food Insecurity (01/26/2024)   Hunger Vital Sign    Worried About Running Out of Food in the Last Year: Never true    Ran Out of Food in the Last Year: Never true  Transportation Needs: No Transportation Needs (01/26/2024)   PRAPARE - Administrator, Civil Service (Medical): No    Lack of Transportation (Non-Medical): No  Physical Activity: Insufficiently Active (01/26/2024)   Exercise Vital Sign    Days of Exercise per Week: 6 days  Minutes of Exercise per Session: 20 min  Stress: Stress Concern Present (01/26/2024)   [employer] of Occupational Health - Occupational Stress Questionnaire    Feeling of Stress : Rather much  Social Connections: Moderately Integrated (01/26/2024)   Social Connection and Isolation Panel [NHANES]    Frequency of Communication with Friends and Family: More than three times a week    Frequency of Social Gatherings with Friends and Family: Patient declined    Attends Religious Services: 1 to 4 times per year    Active Member of [employer] or Organizations: No    Attends Engineer, structural: Not on file    Marital Status: Married  Catering manager  Violence: Not on file    Past Surgical History:  Procedure Laterality Date   I & D EXTREMITY  09/28/2012   Procedure: IRRIGATION AND DEBRIDEMENT EXTREMITY;  Surgeon: Ronn Cohn, MD;  Location: [facility] OR;  Service: Orthopedics;  Laterality: Left;   NERVE AND TENDON REPAIR  09/28/2012   Procedure: NERVE AND TENDON REPAIR;  Surgeon: Ronn Cohn, MD;  Location: [facility] OR;  Service: Orthopedics;  Laterality: Left;   WISDOM TOOTH EXTRACTION  2008    Family History  Problem Relation Age of Onset   Hyperlipidemia Mother    Kidney Stones Father    Hyperlipidemia Father    Gout Father    Heart attack Father 94   Thyroid  disease Sister    Heart disease Maternal Grandfather    Stroke Maternal Grandfather    Kidney Stones Maternal Grandfather    Gout Paternal Grandmother    Heart disease Paternal Grandfather    Kidney Stones Paternal Grandfather    Gout Paternal Grandfather    Leukemia Paternal Grandfather    Heart attack Paternal Grandfather    Cancer Maternal Aunt    Thyroid  disease Maternal Aunt     Allergies  Allergen Reactions   Bee Venom Anaphylaxis    Current Outpatient Medications on File Prior to Visit  Medication Sig Dispense Refill   EPINEPHrine  (AUVI-Q ) 0.3 mg/0.3 mL IJ SOAJ injection Inject 0.3 mg into the muscle as needed for anaphylaxis. 1 each 0   ibuprofen (ADVIL,MOTRIN) 200 MG tablet Take 400 mg by mouth every 6 (six) hours as needed. For pain     ondansetron  (ZOFRAN ) 8 MG tablet Take by mouth every 8 (eight) hours as needed for nausea or vomiting.     allopurinol  (ZYLOPRIM ) 100 MG tablet Take 1 tablet (100 mg total) by mouth daily. For gout prevention (Patient not taking: Reported on 03/18/2024) 90 tablet 0   No current facility-administered medications on file prior to visit.    BP 122/84   Pulse 100   Temp 98.1 F (36.7 C) (Temporal)   Ht 5\' 8"  (1.727 m)   Wt 197 lb (89.4 kg)   SpO2 98%   BMI 29.95 kg/m  Objective:   Physical Exam HENT:     Right Ear:  Tympanic membrane and ear canal normal.     Left Ear: Tympanic membrane and ear canal normal.  Eyes:     Pupils: Pupils are equal, round, and reactive to light.  Cardiovascular:     Rate and Rhythm: Normal rate and regular rhythm.  Pulmonary:     Effort: Pulmonary effort is normal.     Breath sounds: Normal breath sounds.  Abdominal:     General: Bowel sounds are normal.     Palpations: Abdomen is soft.     Tenderness: There is no abdominal tenderness.  Musculoskeletal:     Cervical back: Neck supple.     Right foot: Decreased range of motion. Swelling present.     Comments: Swelling to right MTP joint with mild erythema  Skin:    General: Skin is warm and dry.  Neurological:     Mental Status: He is alert and oriented to person, place, and time.     Cranial Nerves: No cranial nerve deficit.     Deep Tendon Reflexes:     Reflex Scores:      Patellar reflexes are 2+ on the right side and 2+ on the left side. Psychiatric:        Mood and Affect: Mood normal.           Assessment & Plan:  Preventative health care Assessment & Plan: Immunizations UTD.  Discussed the importance of a healthy diet and regular exercise in order for weight loss, and to reduce the risk of further co-morbidity.  Exam stable. Labs pending.  Follow up in 1 year for repeat physical.    Chronic gout involving toe of right foot without tophus, unspecified cause Assessment & Plan: Uncontrolled with recurrent flares.   Unfortunately, he has been unable to start allopurinol  given recurrent symptoms.  Continue prednisone  60 mg x 3 days, 40 mg x 3 days, 20 mg x 3 days. Continue colchicine  0.6 mg BID.  Start allopurinol  100 mg daily when flare resolves. Uric acid pending.    Orders: -     predniSONE ; Take 3 tablets by mouth every morning x 3 days, then take 2 tablets for 3 days, then take 1 tablet for 3 days.  Dispense: 18 tablet; Refill: 0 -     Colchicine ; 2 TABS AT GOUT ONSET AND 1 TAB 2  HRS LATER. THEN 1 TAB TWICE DAILY THEREAFTER UNTIL FLARE RESOLVES  Dispense: 60 tablet; Refill: 0 -     Uric acid  Chronic gout of multiple sites, unspecified cause Assessment & Plan: Uncontrolled with recurrent flares.   Unfortunately, he has been unable to start allopurinol  given recurrent symptoms.  Continue prednisone  60 mg x 3 days, 40 mg x 3 days, 20 mg x 3 days. Continue colchicine  0.6 mg BID.  Start allopurinol  100 mg daily when flare resolves. Uric acid pending.     Mild intermittent asthma without complication Assessment & Plan: Controlled.  Continue albuterol  inhaler PRN.   Family history of heart attack Assessment & Plan: In father and both grandfathers.  Orders placed for coronary calcium CT score. Lipid panel pending.  Orders: -     CT CARDIAC SCORING (SELF PAY ONLY); Future -     Lipid panel  GAD (generalized anxiety disorder) Assessment & Plan: Deteriorated with home and work life.  Therapy was cost prohibitive.  He will be meeting with a close friend soon. He will also start exercising.   Continue to monitor.    Hyperlipidemia, unspecified hyperlipidemia type Assessment & Plan: Repeat lipid panel pending today.  Orders: -     Lipid panel -     Comprehensive metabolic panel with GFR  Mild episode of recurrent major depressive disorder [facility]) Assessment & Plan: Denies concerns today. Continue to monitor.    Umbilical hernia without obstruction and without gangrene Assessment & Plan: Stable.  Planning for surgery in November 2025.         Canisha Issac K Leslye Puccini, NP

## 2024-03-18 NOTE — Assessment & Plan Note (Signed)
 Deteriorated with home and work life.  Therapy was cost prohibitive.  He will be meeting with a close friend soon. He will also start exercising.   Continue to monitor.

## 2024-03-18 NOTE — Assessment & Plan Note (Signed)
 Uncomplicated.  Planning surgery for November 2025.

## 2024-04-04 MED ORDER — INDOMETHACIN 50 MG PO CAPS: 50.0000 mg | ORAL_CAPSULE | Freq: Three times a day (TID) | ORAL | 0 refills | Status: AC

## 2024-04-10 ENCOUNTER — Other Ambulatory Visit: Payer: Self-pay | Admitting: Primary Care

## 2024-04-10 DIAGNOSIS — M1A9XX0 Chronic gout, unspecified, without tophus (tophi): Secondary | ICD-10-CM

## 2024-04-21 ENCOUNTER — Ambulatory Visit
Admission: RE | Admit: 2024-04-21 | Discharge: 2024-04-21 | Disposition: A | Discharge status: Home / Self Care | Payer: Self-pay | Source: Ambulatory Visit | Attending: Primary Care | Admitting: Primary Care

## 2024-04-21 ENCOUNTER — Other Ambulatory Visit

## 2024-04-21 DIAGNOSIS — Z8249 Family history of ischemic heart disease and other diseases of the circulatory system: Secondary | ICD-10-CM | POA: Insufficient documentation

## 2024-04-22 ENCOUNTER — Other Ambulatory Visit: Payer: Self-pay | Admitting: Primary Care

## 2024-04-22 DIAGNOSIS — M1A9XX0 Chronic gout, unspecified, without tophus (tophi): Secondary | ICD-10-CM

## 2024-05-01 ENCOUNTER — Other Ambulatory Visit: Payer: Self-pay | Admitting: Primary Care

## 2024-05-01 DIAGNOSIS — M1A09X0 Idiopathic chronic gout, multiple sites, without tophus (tophi): Secondary | ICD-10-CM

## 2024-07-13 ENCOUNTER — Other Ambulatory Visit: Payer: Self-pay | Admitting: Primary Care

## 2024-07-13 DIAGNOSIS — M1A9XX0 Chronic gout, unspecified, without tophus (tophi): Secondary | ICD-10-CM

## 2024-08-01 ENCOUNTER — Other Ambulatory Visit: Payer: Self-pay | Admitting: Primary Care

## 2024-08-01 DIAGNOSIS — M1A9XX0 Chronic gout, unspecified, without tophus (tophi): Secondary | ICD-10-CM

## 2024-10-26 ENCOUNTER — Ambulatory Visit: Admitting: Surgery

## 2024-10-28 ENCOUNTER — Telehealth: Admitting: Physician Assistant

## 2024-10-28 ENCOUNTER — Encounter: Payer: Self-pay | Admitting: Surgery

## 2024-10-28 ENCOUNTER — Ambulatory Visit: Payer: Worker's Compensation | Admitting: Surgery

## 2024-10-28 VITALS — BP 142/83 | HR 86 | Temp 98.1°F | Ht 68.0 in | Wt 199.8 lb

## 2024-10-28 DIAGNOSIS — H1033 Unspecified acute conjunctivitis, bilateral: Secondary | ICD-10-CM | POA: Diagnosis not present

## 2024-10-28 DIAGNOSIS — K429 Umbilical hernia without obstruction or gangrene: Secondary | ICD-10-CM | POA: Diagnosis not present

## 2024-10-28 DIAGNOSIS — J019 Acute sinusitis, unspecified: Secondary | ICD-10-CM

## 2024-10-28 DIAGNOSIS — K4090 Unilateral inguinal hernia, without obstruction or gangrene, not specified as recurrent: Secondary | ICD-10-CM | POA: Diagnosis not present

## 2024-10-28 MED ORDER — AMOXICILLIN-POT CLAVULANATE 875-125 MG PO TABS: 1.0000 | ORAL_TABLET | Freq: Two times a day (BID) | ORAL | 0 refills | Status: AC

## 2024-10-28 NOTE — H&P (View-Only) (Signed)
 " 10/28/2024  History of Present Illness: Clifford Hall is a 39 y.o. male presenting for follow-up of an umbilical hernia as well as a possible right inguinal hernia.  The patient was last seen on 07/03/2023 at which time the patient reported that he needed to discuss with his family and also at work to see what was the best time for his surgery.  He presents today because he is not ready for surgical management.  The patient is trying to file this is a Worker's Comp case given that his hernias occurred during the time that he has been at work.  He does do heavy lifting at work.  The patient reports that over the last year, he continues having discomfort at the umbilicus particularly with heavy straining.  He has not had any issues in either groin.  Denies any nausea or vomiting.  Denies any fevers or chills.  Past Medical History: Past Medical History:  Diagnosis Date   Asthma    Dyspnea/wheezing 04/15/2016   History of asthma    History of chickenpox    History of headache    Hymenoptera allergy    Poison ivy dermatitis 03/30/2020     Past Surgical History: Past Surgical History:  Procedure Laterality Date   I & D EXTREMITY  09/28/2012   Procedure: IRRIGATION AND DEBRIDEMENT EXTREMITY;  Surgeon: Elsie Mussel, MD;  Location: [facility] OR;  Service: Orthopedics;  Laterality: Left;   NERVE AND TENDON REPAIR  09/28/2012   Procedure: NERVE AND TENDON REPAIR;  Surgeon: Elsie Mussel, MD;  Location: [facility] OR;  Service: Orthopedics;  Laterality: Left;   WISDOM TOOTH EXTRACTION  2008    Home Medications: Prior to Admission medications  Medication Sig Start Date End Date Taking? Authorizing Provider  allopurinol  (ZYLOPRIM ) 100 MG tablet TAKE 1 TABLET (100 MG TOTAL) BY MOUTH DAILY. FOR GOUT PREVENTION 08/01/24  Yes Clark, Katherine K, NP  colchicine  0.6 MG tablet TAKE 2 TABS ORALLY AT GOUT ONSET & 1 TAB 2 HOURS LATER. THEN 1 TAB TWICE DAILY UNTIL FLARE RESOLVES 04/10/24  Yes Clark, Katherine  K, NP  EPINEPHrine  (AUVI-Q ) 0.3 mg/0.3 mL IJ SOAJ injection Inject 0.3 mg into the muscle as needed for anaphylaxis. 10/21/22  Yes Clark, Katherine K, NP  ibuprofen (ADVIL,MOTRIN) 200 MG tablet Take 400 mg by mouth every 6 (six) hours as needed. For pain   Yes [provider]  indomethacin  (INDOCIN ) 50 MG capsule Take 1 capsule (50 mg total) by mouth 3 (three) times daily with meals. As needed for gout flare 04/04/24  Yes Gretta Comer POUR, NP  ondansetron  (ZOFRAN ) 8 MG tablet Take by mouth every 8 (eight) hours as needed for nausea or vomiting.   Yes [provider]  predniSONE  (DELTASONE ) 20 MG tablet Take 3 tablets by mouth every morning x 3 days, then take 2 tablets for 3 days, then take 1 tablet for 3 days. 03/18/24  Yes Clark, Katherine K, NP    Allergies: Allergies[1]  Review of Systems: Review of Systems  Constitutional:  Negative for chills and fever.  Respiratory:  Negative for shortness of breath.   Cardiovascular:  Negative for chest pain.  Gastrointestinal:  Positive for abdominal pain (umbilical discomfort). Negative for nausea and vomiting.    Physical Exam BP (!) 142/83   Pulse 86   Temp 98.1 F (36.7 C) (Oral)   Ht 5' 8 (1.727 m)   Wt 199 lb 12.8 oz (90.6 kg)   SpO2 97%   BMI  30.38 kg/m  CONSTITUTIONAL: No acute distress, well-nourished HEENT:  Normocephalic, atraumatic, extraocular motion intact. RESPIRATORY:  Lungs are clear, and breath sounds are equal bilaterally. Normal respiratory effort without pathologic use of accessory muscles. CARDIOVASCULAR: Heart is regular without murmurs, gallops, or rubs. GI: The abdomen is soft, nondistended, currently nontender.  The patient's umbilical hernia is stable and remains reducible.  On exam today, he definitely has a clear right inguinal hernia although it is small.  Before we were not able to palpate a hernia defect but today it is definitely noticeable.  No hernia noted on the left groin. NEUROLOGIC:   Motor and sensation is grossly normal.  Cranial nerves are grossly intact. PSYCH:  Alert and oriented to person, place and time. Affect is normal.  Labs/Imaging: Labs from 03/18/2024: Sodium 137, potassium 3.7, chloride 103, CO2 23, BUN 20, creatinine 0.92.  LFTs within normal limits.  Assessment and Plan: This is a 39 y.o. male with an umbilical hernia and a right inguinal hernia.  - Discussed with patient that I do agree that given the amount of heavy lifting exertion that he has to do at work, it is likely that these hernias occurred as a result of his work activity duties.  Given that he is symptomatic at the umbilicus, I would recommend repair.  Given that now he does have a more palpable right inguinal hernia, I recommend that we repair this at the same time.  He is in agreement with this. - Discussed with the patient the plan then for robotic assisted right inguinal hernia repair and open umbilical hernia repair both with mesh and reviewed the surgery at length with him including the planned incisions, risks of bleeding, infection, injury to surrounding structures, that this will be an outpatient procedure, postoperative activity restrictions, pain control, and he is willing to proceed. - We will schedule the patient for surgery on 11/17/2024.  All of his questions have been answered.  I spent 30 minutes dedicated to the care of this patient on the date of this encounter to include pre-visit review of records, face-to-face time with the patient discussing diagnosis and management, and any post-visit coordination of care.   Aloysius Sheree Plant, MD [facility]        [1]  Allergies Allergen Reactions   Bee Venom Anaphylaxis   "

## 2024-10-28 NOTE — Patient Instructions (Signed)
 You have requested for your Umbilical Hernia and Inguinal hernia to be repaired. This will be scheduled with Dr. Desiderio at [facility].   If you are on any injectable weight loss medication, you will need to stop taking your GLP-1 injectable (weight loss) medications 8 days before your surgery to avoid any complications with anesthesia.   Please see your (blue)pre-care sheet for information. Our surgery scheduler will call you to verify surgery date and to go over information.   You will need to arrange to be off work for 1-2 weeks but will have to have a lifting restriction of no more than 15 lbs for 6 weeks following your surgery. If you have FMLA or disability paperwork that needs filled out you may drop this off at our office or this can be faxed to [phone].     Umbilical Hernia, Adult A hernia is a bulge of tissue that pushes through an opening between muscles. An umbilical hernia happens in the abdomen, near the belly button (umbilicus). The hernia may contain tissues from the small intestine, large intestine, or fatty tissue covering the intestines (omentum). Umbilical hernias in adults tend to get worse over time, and they require surgical treatment. There are several types of umbilical hernias. You may have: A hernia located just above or below the umbilicus (indirect hernia). This is the most common type of umbilical hernia in adults. A hernia that forms through an opening formed by the umbilicus (direct hernia). A hernia that comes and goes (reducible hernia). A reducible hernia may be visible only when you strain, lift something heavy, or cough. This type of hernia can be pushed back into the abdomen (reduced). A hernia that traps abdominal tissue inside the hernia (incarcerated hernia). This type of hernia cannot be reduced. A hernia that cuts off blood flow to the tissues inside the hernia (strangulated hernia). The tissues can start to die if this  happens. This type of hernia requires emergency treatment.  What are the causes? An umbilical hernia happens when tissue inside the abdomen presses on a weak area of the abdominal muscles. What increases the risk? You may have a greater risk of this condition if you: Are obese. Have had several pregnancies. Have a buildup of fluid inside your abdomen (ascites). Have had surgery that weakens the abdominal muscles.  What are the signs or symptoms? The main symptom of this condition is a painless bulge at or near the belly button. A reducible hernia may be visible only when you strain, lift something heavy, or cough. Other symptoms may include: Dull pain. A feeling of pressure.  Symptoms of a strangulated hernia may include: Pain that gets increasingly worse. Nausea and vomiting. Pain when pressing on the hernia. Skin over the hernia becoming red or purple. Constipation. Blood in the stool.  How is this diagnosed? This condition may be diagnosed based on: A physical exam. You may be asked to cough or strain while standing. These actions increase the pressure inside your abdomen and force the hernia through the opening in your muscles. Your health care provider may try to reduce the hernia by pressing on it. Your symptoms and medical history.  How is this treated? Surgery is the only treatment for an umbilical hernia. Surgery for a strangulated hernia is done as soon as possible. If you have a small hernia that is not incarcerated, you may need to lose weight before having surgery. Follow these instructions at home: Lose weight, if told by  your health care provider. Do not try to push the hernia back in. Watch your hernia for any changes in color or size. Tell your health care provider if any changes occur. You may need to avoid activities that increase pressure on your hernia. Do not lift anything that is heavier than 10 lb (4.5 kg) until your health care provider says that this is  safe. Take over-the-counter and prescription medicines only as told by your health care provider. Keep all follow-up visits as told by your health care provider. This is important. Contact a health care provider if: Your hernia gets larger. Your hernia becomes painful. Get help right away if: You develop sudden, severe pain near the area of your hernia. You have pain as well as nausea or vomiting. You have pain and the skin over your hernia changes color. You develop a fever. This information is not intended to replace advice given to you by your health care provider. Make sure you discuss any questions you have with your health care provider. Document Released: 03/07/2016 Document Revised: 06/08/2016 Document Reviewed: 03/07/2016 Elsevier Interactive Patient Education  [employer].

## 2024-10-28 NOTE — Progress Notes (Signed)
 " 10/28/2024  History of Present Illness: Clifford Hall is a 39 y.o. male presenting for follow-up of an umbilical hernia as well as a possible right inguinal hernia.  The patient was last seen on 07/03/2023 at which time the patient reported that he needed to discuss with his family and also at work to see what was the best time for his surgery.  He presents today because he is not ready for surgical management.  The patient is trying to file this is a Worker's Comp case given that his hernias occurred during the time that he has been at work.  He does do heavy lifting at work.  The patient reports that over the last year, he continues having discomfort at the umbilicus particularly with heavy straining.  He has not had any issues in either groin.  Denies any nausea or vomiting.  Denies any fevers or chills.  Past Medical History: Past Medical History:  Diagnosis Date   Asthma    Dyspnea/wheezing 04/15/2016   History of asthma    History of chickenpox    History of headache    Hymenoptera allergy    Poison ivy dermatitis 03/30/2020     Past Surgical History: Past Surgical History:  Procedure Laterality Date   I & D EXTREMITY  09/28/2012   Procedure: IRRIGATION AND DEBRIDEMENT EXTREMITY;  Surgeon: Elsie Mussel, MD;  Location: [facility] OR;  Service: Orthopedics;  Laterality: Left;   NERVE AND TENDON REPAIR  09/28/2012   Procedure: NERVE AND TENDON REPAIR;  Surgeon: Elsie Mussel, MD;  Location: [facility] OR;  Service: Orthopedics;  Laterality: Left;   WISDOM TOOTH EXTRACTION  2008    Home Medications: Prior to Admission medications  Medication Sig Start Date End Date Taking? Authorizing Provider  allopurinol  (ZYLOPRIM ) 100 MG tablet TAKE 1 TABLET (100 MG TOTAL) BY MOUTH DAILY. FOR GOUT PREVENTION 08/01/24  Yes Clark, Katherine K, NP  colchicine  0.6 MG tablet TAKE 2 TABS ORALLY AT GOUT ONSET & 1 TAB 2 HOURS LATER. THEN 1 TAB TWICE DAILY UNTIL FLARE RESOLVES 04/10/24  Yes Clark, Katherine  K, NP  EPINEPHrine  (AUVI-Q ) 0.3 mg/0.3 mL IJ SOAJ injection Inject 0.3 mg into the muscle as needed for anaphylaxis. 10/21/22  Yes Clark, Katherine K, NP  ibuprofen (ADVIL,MOTRIN) 200 MG tablet Take 400 mg by mouth every 6 (six) hours as needed. For pain   Yes [provider]  indomethacin  (INDOCIN ) 50 MG capsule Take 1 capsule (50 mg total) by mouth 3 (three) times daily with meals. As needed for gout flare 04/04/24  Yes Gretta Comer POUR, NP  ondansetron  (ZOFRAN ) 8 MG tablet Take by mouth every 8 (eight) hours as needed for nausea or vomiting.   Yes [provider]  predniSONE  (DELTASONE ) 20 MG tablet Take 3 tablets by mouth every morning x 3 days, then take 2 tablets for 3 days, then take 1 tablet for 3 days. 03/18/24  Yes Clark, Katherine K, NP    Allergies: Allergies[1]  Review of Systems: Review of Systems  Constitutional:  Negative for chills and fever.  Respiratory:  Negative for shortness of breath.   Cardiovascular:  Negative for chest pain.  Gastrointestinal:  Positive for abdominal pain (umbilical discomfort). Negative for nausea and vomiting.    Physical Exam BP (!) 142/83   Pulse 86   Temp 98.1 F (36.7 C) (Oral)   Ht 5' 8 (1.727 m)   Wt 199 lb 12.8 oz (90.6 kg)   SpO2 97%   BMI  30.38 kg/m  CONSTITUTIONAL: No acute distress, well-nourished HEENT:  Normocephalic, atraumatic, extraocular motion intact. RESPIRATORY:  Lungs are clear, and breath sounds are equal bilaterally. Normal respiratory effort without pathologic use of accessory muscles. CARDIOVASCULAR: Heart is regular without murmurs, gallops, or rubs. GI: The abdomen is soft, nondistended, currently nontender.  The patient's umbilical hernia is stable and remains reducible.  On exam today, he definitely has a clear right inguinal hernia although it is small.  Before we were not able to palpate a hernia defect but today it is definitely noticeable.  No hernia noted on the left groin. NEUROLOGIC:   Motor and sensation is grossly normal.  Cranial nerves are grossly intact. PSYCH:  Alert and oriented to person, place and time. Affect is normal.  Labs/Imaging: Labs from 03/18/2024: Sodium 137, potassium 3.7, chloride 103, CO2 23, BUN 20, creatinine 0.92.  LFTs within normal limits.  Assessment and Plan: This is a 39 y.o. male with an umbilical hernia and a right inguinal hernia.  - Discussed with patient that I do agree that given the amount of heavy lifting exertion that he has to do at work, it is likely that these hernias occurred as a result of his work activity duties.  Given that he is symptomatic at the umbilicus, I would recommend repair.  Given that now he does have a more palpable right inguinal hernia, I recommend that we repair this at the same time.  He is in agreement with this. - Discussed with the patient the plan then for robotic assisted right inguinal hernia repair and open umbilical hernia repair both with mesh and reviewed the surgery at length with him including the planned incisions, risks of bleeding, infection, injury to surrounding structures, that this will be an outpatient procedure, postoperative activity restrictions, pain control, and he is willing to proceed. - We will schedule the patient for surgery on 11/17/2024.  All of his questions have been answered.  I spent 30 minutes dedicated to the care of this patient on the date of this encounter to include pre-visit review of records, face-to-face time with the patient discussing diagnosis and management, and any post-visit coordination of care.   Aloysius Sheree Plant, MD [facility]        [1]  Allergies Allergen Reactions   Bee Venom Anaphylaxis   "

## 2024-10-28 NOTE — Progress Notes (Signed)
"      E-Visit for Sinus Problems  We are sorry that you are not feeling well.  Here is how we plan to help!  Based on what you have shared with me it looks like you have sinusitis.  Sinusitis is inflammation and infection in the sinus cavities of the head.  Based on your presentation I believe you most likely have Acute Bacterial Sinusitis.  This is an infection caused by bacteria and is treated with antibiotics. I have prescribed Augmentin  875mg /125mg  one tablet twice daily with food, for 7 days. (This should cover both the sinus infection and eye infection) You may use an oral decongestant such as Mucinex D or if you have glaucoma or high blood pressure use plain Mucinex. Saline nasal spray help and can safely be used as often as needed for congestion.  If you develop worsening sinus pain, fever or notice severe headache and vision changes, or if symptoms are not better after completion of antibiotic, please schedule an appointment with a health care provider.    Sinus infections are not as easily transmitted as other respiratory infection, however we still recommend that you avoid close contact with loved ones, especially the very young and elderly.  Remember to wash your hands thoroughly throughout the day as this is the number one way to prevent the spread of infection!  Home Care: Only take medications as instructed by your medical team. Complete the entire course of an antibiotic. Do not take these medications with alcohol. A steam or ultrasonic humidifier can help congestion.  You can place a towel over your head and breathe in the steam from hot water coming from a faucet. Avoid close contacts especially the very young and the elderly. Cover your mouth when you cough or sneeze. Always remember to wash your hands.  Get Help Right Away If: You develop worsening fever or sinus pain. You develop a severe head ache or visual changes. Your symptoms persist after you have completed your treatment  plan.  Make sure you Understand these instructions. Will watch your condition. Will get help right away if you are not doing well or get worse.  Your e-visit answers were reviewed by a board certified advanced clinical practitioner to complete your personal care plan.  Depending on the condition, your plan could have included both over the counter or prescription medications.  If there is a problem please reply  once you have received a response from your provider.  Your safety is important to us .  If you have drug allergies check your prescription carefully.    You can use MyChart to ask questions about todays visit, request a non-urgent call back, or ask for a work or school excuse for 24 hours related to this e-Visit. If it has been greater than 24 hours you will need to follow up with your provider, or enter a new e-Visit to address those concerns.  You will get an e-mail in the next two days asking about your experience.  I hope that your e-visit has been valuable and will speed your recovery. Thank you for using e-visits.  I have spent 5 minutes in review of e-visit questionnaire, review and updating patient chart, medical decision making and response to patient.   Roosvelt Mater, PA-C     "

## 2024-10-31 ENCOUNTER — Telehealth: Payer: Self-pay | Admitting: Surgery

## 2024-10-31 NOTE — Telephone Encounter (Signed)
 Patient has been advised of Pre-Admission date/time, and Surgery date at [facility].  Surgery Date: 11/17/24 Preadmission Testing Date: 11/11/24 (phone 1p-4p)  Patient informed of the scheduling process and surgery information given at time of office visit.   Patient has been made aware to call [phone], between 1-3:00pm the day before surgery, to find out what time to arrive for surgery.

## 2024-11-11 ENCOUNTER — Other Ambulatory Visit: Payer: Self-pay

## 2024-11-11 ENCOUNTER — Encounter
Admission: RE | Admit: 2024-11-11 | Discharge: 2024-11-11 | Disposition: A | Source: Ambulatory Visit | Attending: Surgery

## 2024-11-11 HISTORY — DX: Gastro-esophageal reflux disease without esophagitis: K21.9

## 2024-11-11 HISTORY — DX: Hyperlipidemia, unspecified: E78.5

## 2024-11-11 HISTORY — DX: Depression, unspecified: F32.A

## 2024-11-11 HISTORY — DX: Unilateral inguinal hernia, without obstruction or gangrene, not specified as recurrent: K40.90

## 2024-11-11 HISTORY — DX: Chronic gout, unspecified, without tophus (tophi): M1A.9XX0

## 2024-11-11 HISTORY — DX: Umbilical hernia without obstruction or gangrene: K42.9

## 2024-11-11 HISTORY — DX: Anxiety disorder, unspecified: F41.9

## 2024-11-11 NOTE — Patient Instructions (Addendum)
 Your procedure is scheduled on:11-17-24 Thursday Report to the Registration Desk on the 1st floor of the [facility].Then proceed to the 2nd floor Surgery Desk To find out your arrival time, please call [phone] between 1PM - 3PM on:11-16-24 Wednesday If your arrival time is 6:00 am, do not arrive before that time as the [facility] entrance doors do not open until 6:00 am.  REMEMBER: Instructions that are not followed completely may result in serious medical risk, up to and including death; or upon the discretion of your surgeon and anesthesiologist your surgery may need to be rescheduled.  Do not eat food after midnight the night before surgery.  No gum chewing or hard candies.  You may however, drink CLEAR liquids up to 2 hours before you are scheduled to arrive for your surgery. Do not drink anything within 2 hours of your scheduled arrival time.  Clear liquids include: - water  - apple juice without pulp - gatorade (not RED colors) - black coffee or tea (Do NOT add milk or creamers to the coffee or tea) Do NOT drink anything that is not on this list.  One week prior to surgery:Stop NOW (11-11-24) Stop Anti-inflammatories (NSAIDS) such as Advil, Aleve, Ibuprofen, Motrin, Naproxen, Naprosyn and Aspirin based products such as Excedrin, Goody's Powder, BC Powder. Stop ANY OVER THE COUNTER supplements until after surgery.  You may however, continue to take Tylenol  if needed for pain up until the day of surgery.  Continue taking all of your other prescription medications up until the day of surgery.  Do NOT take any medication the day of surgery  Bring your Albuterol  Inhaler to the hospital  No Alcohol for 24 hours before or after surgery.  No Smoking including e-cigarettes for 24 hours before surgery.  No chewable tobacco products for at least 6 hours before surgery.  No nicotine patches on the day of surgery.  Do not use any recreational drugs for at least a week  (preferably 2 weeks) before your surgery.  Please be advised that the combination of cocaine and anesthesia may have negative outcomes, up to and including death. If you test positive for cocaine, your surgery will be cancelled.  On the morning of surgery brush your teeth with toothpaste and water, you may rinse your mouth with mouthwash if you wish. Do not swallow any toothpaste or mouthwash.  Use CHG Soap as directed on instruction sheet.  Do not wear jewelry, make-up, hairpins, clips or nail polish.  For welded (permanent) jewelry: bracelets, anklets, waist bands, etc.  Please have this removed prior to surgery.  If it is not removed, there is a chance that hospital personnel will need to cut it off on the day of surgery.  Do not wear lotions, powders, or perfumes.   Do not shave body hair from the neck down 48 hours before surgery.  Contact lenses, hearing aids and dentures may not be worn into surgery.  Do not bring valuables to the hospital. [facility] is not responsible for any missing/lost belongings or valuables.   Notify your doctor if there is any change in your medical condition (cold, fever, infection).  Wear comfortable clothing (specific to your surgery type) to the hospital.  After surgery, you can help prevent lung complications by doing breathing exercises.  Take deep breaths and cough every 1-2 hours. Your doctor may order a device called an Incentive Spirometer to help you take deep breaths. When coughing or sneezing, hold a pillow firmly against your  incision with both hands. This is called splinting. Doing this helps protect your incision. It also decreases belly discomfort.  If you are being admitted to the hospital overnight, leave your suitcase in the car. After surgery it may be brought to your room.  In case of increased patient census, it may be necessary for you, the patient, to continue your postoperative care in the Same Day Surgery department.  If  you are being discharged the day of surgery, you will not be allowed to drive home. You will need a responsible individual to drive you home and stay with you for 24 hours after surgery.   If you are taking public transportation, you will need to have a responsible individual with you.  Please call the Pre-admissions Testing Dept. at [phone] if you have any questions about these instructions.  Surgery Visitation Policy:  Patients having surgery or a procedure may have two visitors.  Children under the age of 58 must have an adult with them who is not the patient.  TO PICK UP ANTI-BACTERIAL SOAP: Come to [address] [facility]). As you enter the lobby, we are the 1st office on the right (Pre-Admission Testing) [address]. You can pick up soap and instructions between the hours of 8am-4pm                                                                                                             Preparing for Surgery with CHLORHEXIDINE GLUCONATE (CHG) Soap  Chlorhexidine Gluconate (CHG) Soap  o An antiseptic cleaner that kills germs and bonds with the skin to continue killing germs even after washing  o Used for showering the night before surgery and morning of surgery  Before surgery, you can play an important role by reducing the number of germs on your skin.  CHG (Chlorhexidine gluconate) soap is an antiseptic cleanser which kills germs and bonds with the skin to continue killing germs even after washing.  Please do not use if you have an allergy to CHG or antibacterial soaps. If your skin becomes reddened/irritated stop using the CHG.  1. Shower the NIGHT BEFORE SURGERY with CHG soap.  2. If you choose to wash your hair, wash your hair first as usual with your normal shampoo.  3. After shampooing, rinse your hair and body thoroughly to remove the shampoo.  4. Use CHG as you would any other liquid soap. You can apply CHG directly to the skin and wash  gently with a clean washcloth.  5. Apply the CHG soap to your body only from the neck down. Do not use on open wounds or open sores. Avoid contact with your eyes, ears, mouth, and genitals (private parts). Wash face and genitals (private parts) with your normal soap.  6. Wash thoroughly, paying special attention to the area where your surgery will be performed.  7. Thoroughly rinse your body with warm water.  8. Do not shower/wash with your normal soap after using and rinsing off the CHG soap.  9.  Do not use lotions, oils, etc., after showering with CHG.  10. Pat yourself dry with a clean towel.  11. Wear clean pajamas to bed the night before surgery.  12. Place clean sheets on your bed the night of your shower and do not sleep with pets.  13. Do not apply any deodorants/lotions/powders.  14. Please wear clean clothes to the hospital.  15. Remember to brush your teeth with your regular toothpaste. Merchandiser, Retail to address health-related social needs:  [email or web address]

## 2024-11-17 ENCOUNTER — Other Ambulatory Visit: Payer: Self-pay

## 2024-11-17 ENCOUNTER — Ambulatory Visit: Payer: Worker's Compensation | Admitting: Certified Registered Nurse Anesthetist

## 2024-11-17 ENCOUNTER — Ambulatory Visit
Admission: EM | Admit: 2024-11-17 | Discharge: 2024-11-17 | Disposition: A | Discharge status: Home / Self Care | Payer: Worker's Compensation | Attending: Surgery | Admitting: Surgery

## 2024-11-17 ENCOUNTER — Encounter
Admission: EM | Disposition: A | Discharge status: Home / Self Care | Payer: Self-pay | Source: Home / Self Care | Attending: Surgery

## 2024-11-17 DIAGNOSIS — K4090 Unilateral inguinal hernia, without obstruction or gangrene, not specified as recurrent: Secondary | ICD-10-CM | POA: Diagnosis present

## 2024-11-17 DIAGNOSIS — K429 Umbilical hernia without obstruction or gangrene: Secondary | ICD-10-CM | POA: Diagnosis present

## 2024-11-17 MED ORDER — PROPOFOL 10 MG/ML IV BOLUS
INTRAVENOUS | Status: DC | PRN
  Administered 2024-11-17: 200 mg via INTRAVENOUS

## 2024-11-17 MED ORDER — OXYCODONE HCL 5 MG/5ML PO SOLN: 5.0000 mg | Freq: Once | ORAL | Status: AC | PRN

## 2024-11-17 MED ORDER — FENTANYL CITRATE (PF) 100 MCG/2ML IJ SOLN
INTRAMUSCULAR | Status: AC
  Filled 2024-11-17: qty 2

## 2024-11-17 MED ORDER — ACETAMINOPHEN 500 MG PO TABS
1000.0000 mg | ORAL_TABLET | ORAL | Status: AC
  Administered 2024-11-17: 1000 mg via ORAL

## 2024-11-17 MED ORDER — BUPIVACAINE LIPOSOME 1.3 % IJ SUSP: 20.0000 mL | Freq: Once | INTRAMUSCULAR | Status: DC

## 2024-11-17 MED ORDER — ROCURONIUM BROMIDE 100 MG/10ML IV SOLN
INTRAVENOUS | Status: DC | PRN
  Administered 2024-11-17: 20 mg via INTRAVENOUS
  Administered 2024-11-17: 50 mg via INTRAVENOUS
  Administered 2024-11-17: 20 mg via INTRAVENOUS
  Administered 2024-11-17: 10 mg via INTRAVENOUS

## 2024-11-17 MED ORDER — ONDANSETRON HCL 4 MG PO TABS: 4.0000 mg | ORAL_TABLET | Freq: Three times a day (TID) | ORAL | 0 refills | Status: AC | PRN

## 2024-11-17 MED ORDER — BUPIVACAINE-EPINEPHRINE 0.5% -1:200000 IJ SOLN
INTRAMUSCULAR | Status: DC | PRN
  Administered 2024-11-17: 40 mL via INTRAMUSCULAR

## 2024-11-17 MED ORDER — CHLORHEXIDINE GLUCONATE 0.12 % MT SOLN
15.0000 mL | Freq: Once | OROMUCOSAL | Status: AC
  Administered 2024-11-17: 15 mL via OROMUCOSAL

## 2024-11-17 MED ORDER — FENTANYL CITRATE (PF) 100 MCG/2ML IJ SOLN: 25.0000 ug | INTRAMUSCULAR | Status: DC | PRN

## 2024-11-17 MED ORDER — ROCURONIUM BROMIDE 10 MG/ML (PF) SYRINGE
PREFILLED_SYRINGE | INTRAVENOUS | Status: AC
  Filled 2024-11-17: qty 10

## 2024-11-17 MED ORDER — OXYCODONE HCL 5 MG PO TABS: 5.0000 mg | ORAL_TABLET | ORAL | 0 refills | Status: AC | PRN

## 2024-11-17 MED ORDER — IBUPROFEN 600 MG PO TABS: 600.0000 mg | ORAL_TABLET | Freq: Three times a day (TID) | ORAL | 1 refills | Status: AC | PRN

## 2024-11-17 MED ORDER — SUGAMMADEX SODIUM 200 MG/2ML IV SOLN
INTRAVENOUS | Status: DC | PRN
  Administered 2024-11-17: 181.4 mg via INTRAVENOUS

## 2024-11-17 MED ORDER — ACETAMINOPHEN 500 MG PO TABS: 1000.0000 mg | ORAL_TABLET | Freq: Four times a day (QID) | ORAL | Status: AC | PRN

## 2024-11-17 MED ORDER — ONDANSETRON HCL 4 MG/2ML IJ SOLN
INTRAMUSCULAR | Status: AC
  Filled 2024-11-17: qty 2

## 2024-11-17 MED ORDER — KETOROLAC TROMETHAMINE 30 MG/ML IJ SOLN
INTRAMUSCULAR | Status: AC
  Filled 2024-11-17: qty 1

## 2024-11-17 MED ORDER — DEXAMETHASONE SOD PHOSPHATE PF 10 MG/ML IJ SOLN
INTRAMUSCULAR | Status: AC
  Filled 2024-11-17: qty 1

## 2024-11-17 MED ORDER — KETAMINE HCL 50 MG/5ML IJ SOSY
PREFILLED_SYRINGE | INTRAMUSCULAR | Status: DC | PRN
  Administered 2024-11-17: 30 mg via INTRAVENOUS
  Administered 2024-11-17 (×2): 10 mg via INTRAVENOUS

## 2024-11-17 MED ORDER — LACTATED RINGERS IV SOLN: INTRAVENOUS | Status: DC

## 2024-11-17 MED ORDER — LIDOCAINE HCL (CARDIAC) PF 100 MG/5ML IV SOSY
PREFILLED_SYRINGE | INTRAVENOUS | Status: DC | PRN
  Administered 2024-11-17: 100 mg via INTRAVENOUS

## 2024-11-17 MED ORDER — CHLORHEXIDINE GLUCONATE CLOTH 2 % EX PADS: 6.0000 | MEDICATED_PAD | Freq: Once | CUTANEOUS | Status: DC

## 2024-11-17 MED ORDER — CHLORHEXIDINE GLUCONATE 0.12 % MT SOLN
OROMUCOSAL | Status: AC
  Filled 2024-11-17: qty 15

## 2024-11-17 MED ORDER — DEXAMETHASONE SOD PHOSPHATE PF 10 MG/ML IJ SOLN
INTRAMUSCULAR | Status: DC | PRN
  Administered 2024-11-17: 10 mg via INTRAVENOUS

## 2024-11-17 MED ORDER — GABAPENTIN 300 MG PO CAPS
300.0000 mg | ORAL_CAPSULE | ORAL | Status: AC
  Administered 2024-11-17: 300 mg via ORAL

## 2024-11-17 MED ORDER — ONDANSETRON HCL 4 MG/2ML IJ SOLN
4.0000 mg | Freq: Once | INTRAMUSCULAR | Status: AC | PRN
  Administered 2024-11-17: 4 mg via INTRAVENOUS

## 2024-11-17 MED ORDER — ORAL CARE MOUTH RINSE: 15.0000 mL | Freq: Once | OROMUCOSAL | Status: AC

## 2024-11-17 MED ORDER — ACETAMINOPHEN 500 MG PO TABS
ORAL_TABLET | ORAL | Status: AC
  Filled 2024-11-17: qty 2

## 2024-11-17 MED ORDER — KETAMINE HCL 50 MG/5ML IJ SOSY
PREFILLED_SYRINGE | INTRAMUSCULAR | Status: AC
  Filled 2024-11-17: qty 5

## 2024-11-17 MED ORDER — CEFAZOLIN SODIUM-DEXTROSE 2-4 GM/100ML-% IV SOLN
2.0000 g | INTRAVENOUS | Status: AC
  Administered 2024-11-17: 2 g via INTRAVENOUS

## 2024-11-17 MED ORDER — FENTANYL CITRATE (PF) 100 MCG/2ML IJ SOLN
INTRAMUSCULAR | Status: DC | PRN
  Administered 2024-11-17: 100 ug via INTRAVENOUS

## 2024-11-17 MED ORDER — ACETAMINOPHEN 10 MG/ML IV SOLN: 1000.0000 mg | Freq: Once | INTRAVENOUS | Status: DC | PRN

## 2024-11-17 MED ORDER — BUPIVACAINE-EPINEPHRINE (PF) 0.5% -1:200000 IJ SOLN
INTRAMUSCULAR | Status: AC
  Filled 2024-11-17: qty 30

## 2024-11-17 MED ORDER — OXYCODONE HCL 5 MG PO TABS
5.0000 mg | ORAL_TABLET | Freq: Once | ORAL | Status: AC | PRN
  Administered 2024-11-17: 5 mg via ORAL

## 2024-11-17 MED ORDER — GABAPENTIN 300 MG PO CAPS
ORAL_CAPSULE | ORAL | Status: AC
  Filled 2024-11-17: qty 1

## 2024-11-17 MED ORDER — CEFAZOLIN SODIUM-DEXTROSE 2-4 GM/100ML-% IV SOLN
INTRAVENOUS | Status: AC
  Filled 2024-11-17: qty 100

## 2024-11-17 MED ORDER — KETOROLAC TROMETHAMINE 30 MG/ML IJ SOLN
INTRAMUSCULAR | Status: DC | PRN
  Administered 2024-11-17: 30 mg via INTRAVENOUS

## 2024-11-17 MED ORDER — LIDOCAINE HCL (PF) 2 % IJ SOLN
INTRAMUSCULAR | Status: AC
  Filled 2024-11-17: qty 5

## 2024-11-17 MED ORDER — OXYCODONE HCL 5 MG PO TABS
ORAL_TABLET | ORAL | Status: AC
  Filled 2024-11-17: qty 1

## 2024-11-17 MED ORDER — BUPIVACAINE LIPOSOME 1.3 % IJ SUSP
INTRAMUSCULAR | Status: AC
  Filled 2024-11-17: qty 10

## 2024-11-17 MED ORDER — MIDAZOLAM HCL 2 MG/2ML IJ SOLN
INTRAMUSCULAR | Status: AC
  Filled 2024-11-17: qty 2

## 2024-11-17 MED ORDER — PHENYLEPHRINE 80 MCG/ML (10ML) SYRINGE FOR IV PUSH (FOR BLOOD PRESSURE SUPPORT)
PREFILLED_SYRINGE | INTRAVENOUS | Status: DC | PRN
  Administered 2024-11-17 (×3): 80 ug via INTRAVENOUS

## 2024-11-17 MED ORDER — MIDAZOLAM HCL (PF) 2 MG/2ML IJ SOLN
INTRAMUSCULAR | Status: DC | PRN
  Administered 2024-11-17: 2 mg via INTRAVENOUS

## 2024-11-17 MED ORDER — DEXMEDETOMIDINE HCL IN NACL 400 MCG/100ML IV SOLN
INTRAVENOUS | Status: DC | PRN
  Administered 2024-11-17: 4 ug via INTRAVENOUS
  Administered 2024-11-17: 8 ug via INTRAVENOUS
  Administered 2024-11-17: 4 ug via INTRAVENOUS

## 2024-11-17 MED ORDER — ONDANSETRON HCL 4 MG/2ML IJ SOLN
INTRAMUSCULAR | Status: DC | PRN
  Administered 2024-11-17: 4 mg via INTRAVENOUS

## 2024-11-17 NOTE — Transfer of Care (Signed)
 Immediate Anesthesia Transfer of Care Note  Patient: Clifford Hall  Procedure(s) Performed: HERNIORRHAPHY, INGUINAL, ROBOT-ASSISTED, LAPAROSCOPIC (Right) REPAIR, HERNIA, UMBILICAL, ADULT INSERTION OF MESH  Patient Location: PACU  Anesthesia Type:General  Level of Consciousness: drowsy  Airway & Oxygen Therapy: Patient Spontanous Breathing and Patient connected to face mask oxygen  Post-op Assessment: Report given to RN and Post -op Vital signs reviewed and stable  Post vital signs: Reviewed and stable  Last Vitals:  Vitals Value Taken Time  BP 88/49 11/17/24 14:04  Temp    Pulse 88 11/17/24 14:06  Resp 18 11/17/24 14:06  SpO2 99 % 11/17/24 14:06  Vitals shown include unfiled device data.  Last Pain:  Vitals:   11/17/24 0957  TempSrc: Temporal         Complications: No notable events documented.

## 2024-11-17 NOTE — Anesthesia Procedure Notes (Signed)
 Procedure Name: Intubation Date/Time: 11/17/2024 11:30 AM  Performed by: Duwayne Craven, CRNAPre-anesthesia Checklist: Patient identified, Patient being monitored, Timeout performed, Emergency Drugs available and Suction available Patient Re-evaluated:Patient Re-evaluated prior to induction Oxygen Delivery Method: Circle system utilized Preoxygenation: Pre-oxygenation with 100% oxygen Induction Type: IV induction Ventilation: Mask ventilation without difficulty Laryngoscope Size: McGrath and 4 Grade View: Grade II Tube type: Oral Tube size: 7.5 mm Number of attempts: 1 Airway Equipment and Method: Stylet and Video-laryngoscopy Placement Confirmation: ETT inserted through vocal cords under direct vision, positive ETCO2 and breath sounds checked- equal and bilateral Secured at: 23 cm Tube secured with: Tape Dental Injury: Teeth and Oropharynx as per pre-operative assessment

## 2024-11-17 NOTE — Anesthesia Postprocedure Evaluation (Signed)
"   Anesthesia Post Note  Patient: Clifford Hall  Procedure(s) Performed: HERNIORRHAPHY, INGUINAL, ROBOT-ASSISTED, LAPAROSCOPIC (Right) REPAIR, HERNIA, UMBILICAL, ADULT INSERTION OF MESH  Patient location during evaluation: PACU Anesthesia Type: General Level of consciousness: awake and alert Pain management: pain level controlled Vital Signs Assessment: post-procedure vital signs reviewed and stable Respiratory status: spontaneous breathing, nonlabored ventilation, respiratory function stable and patient connected to nasal cannula oxygen Cardiovascular status: blood pressure returned to baseline and stable Postop Assessment: no apparent nausea or vomiting Anesthetic complications: no   No notable events documented.   Last Vitals:  Vitals:   11/17/24 0957 11/17/24 1404  BP: (!) 115/95 (!) 88/49  Pulse: 65 90  Resp:  18  Temp: (!) 36.1 C 36.6 C  SpO2: 100% 99%    Last Pain:  Vitals:   11/17/24 0957  TempSrc: Temporal                 [name]      "

## 2024-11-17 NOTE — Anesthesia Postprocedure Evaluation (Signed)
"   Anesthesia Post Note  Patient: Clifford Hall  Procedure(s) Performed: HERNIORRHAPHY, INGUINAL, ROBOT-ASSISTED, LAPAROSCOPIC (Right) REPAIR, HERNIA, UMBILICAL, ADULT INSERTION OF MESH  Anesthesia Type: General Anesthetic complications: no   No notable events documented.   Last Vitals:  Vitals:   11/17/24 0957 11/17/24 1404  BP: (!) 115/95 (!) 88/49  Pulse: 65 90  Resp:  18  Temp: (!) 36.1 C 36.6 C  SpO2: 100% 99%    Last Pain:  Vitals:   11/17/24 0957  TempSrc: Temporal                 [name]      "

## 2024-11-17 NOTE — Anesthesia Preprocedure Evaluation (Signed)
"                                    Anesthesia Evaluation  Patient identified by MRN, date of birth, ID band Patient awake    Reviewed: Allergy & Precautions, H&P , NPO status , Patient's Chart, lab work & pertinent test results  Airway Mallampati: II  TM Distance: >3 FB Neck ROM: Full    Dental no notable dental hx. (+) Teeth Intact   Pulmonary neg pulmonary ROS   Pulmonary exam normal breath sounds clear to auscultation       Cardiovascular negative cardio ROS Normal cardiovascular exam Rhythm:Regular Rate:Normal     Neuro/Psych negative neurological ROS  negative psych ROS   GI/Hepatic negative GI ROS, Neg liver ROS,,,  Endo/Other  negative endocrine ROS    Renal/GU negative Renal ROS  negative genitourinary   Musculoskeletal negative musculoskeletal ROS (+)    Abdominal   Peds negative pediatric ROS (+)  Hematology negative hematology ROS (+)   Anesthesia Other Findings   Reproductive/Obstetrics negative OB ROS                              Anesthesia Physical Anesthesia Plan  ASA: 1  Anesthesia Plan: General   Post-op Pain Management:    Induction: Intravenous  PONV Risk Score and Plan: 0  Airway Management Planned: Oral ETT  Additional Equipment:   Intra-op Plan:   Post-operative Plan: Extubation in OR  Informed Consent: I have reviewed the patients History and Physical, chart, labs and discussed the procedure including the risks, benefits and alternatives for the proposed anesthesia with the patient or authorized representative who has indicated his/her understanding and acceptance.     Dental advisory given  Plan Discussed with: CRNA  Anesthesia Plan Comments:         Anesthesia Quick Evaluation  "

## 2024-11-17 NOTE — Op Note (Signed)
 " Procedure Date:  11/17/2024  Pre-operative Diagnosis:  Right inguinal hernia, umbilical hernia  Post-operative Diagnosis: Right inguinal hernia, reducible umbilical hernia 1.8 cm.  Procedure: 1.  Robotic assisted right Inguinal Hernia Repair 2.  Creation of right Posterior Rectus-Transversalis Fascia Advancment Flap for Coverage of Pelvic Wound (200 cm) 3.  Open umbilical hernia repair with mesh  Surgeon:  Aloysius Sheree Plant, MD  Anesthesia:  General endotracheal  Estimated Blood Loss:  15 ml  Specimens:  None  Complications:  There was heat transfer from cautery between the peritoneal flap onto a segment of small bowel.  As a precaution, although there was no bowel wall injury, I reinforced the segment of heat transfer using 3-0 Silk suture x 2.  Indications for Procedure:  This is a 39 y.o. male who presents with a right inguinal hernia and umbilical hernia.  The options of surgery versus observation were reviewed with the patient and/or family. The risks of bleeding, abscess or infection, recurrence of symptoms, potential for an open procedure, injury to surrounding structures, and chronic pain were all discussed with the patient and he was willing to proceed.  We have planned this transabdominal procedure with the creation of a right peritoneal flap based on the posterior rectus sheath and transversalis fascia in order to fully cover the mesh, creating a natural tisssue barrier for the bowel and peritoneal cavity.  Description of Procedure: The patient was correctly identified in the preoperative area and brought into the operating room.  The patient was placed supine with VTE prophylaxis in place.  Appropriate time-outs were performed.  Anesthesia was induced and the patient was intubated.  Foley catheter was placed.  Appropriate antibiotics were infused.  The abdomen was prepped and draped in a sterile fashion. A supraumbilical incision was made. Cautery was used to dissect along the  umbilical stalk, revealing a hernia defect containing preperitoneal fat.  The fat was dissected and resected and the hernia defect measured 1.8 cm.  The fascial edges were cleared and a 12 mm port was inserted.  Pneumoperitoneum was obtained with appropriate opening pressures.  A Veress needle was used to start dissecting the peritoneal flap.  Two 8-mm robotic ports were placed in the right and left lateral positions under direct visualization.  A large right Bard 3D Max Mesh, a 2-0 Vicryl, and 2-0 Stratafix suture were placed through the umbilical port under direct visualization.  The [employer] platform was docked onto the patient, the camera was inserted and targeted, and the instruments were placed under direct visualization.  Both inguinal regions were inspected for hernias and it was confirmed that the patient had a right inguinal hernia.  Using electocautery, the peritoneal and posterior rectus tissue flap was created.  The peritoneum on the right side was scored from the median umbilical ligament laterally towards the ASIS.  The flap was mobilized using robotic scissors and the bipolar instruments, creating a plane along the posterior rectus sheath and transversalis fascia down to the pubic tubercle medially. It was then further mobilized laterally across the inguinal canal and femoral vessels and onto the psoas muscle. While doing this, there was heat transfer from the cautery onto the peritoneal flap and onto a short segment of small bowel in the right lower quadrant.  There was no bowel injury or tear, but as a precaution, decided to reinforce this area using 3-0 Silk suture x 2.  No complications from this.  We continued with dissection of the peritoneal flap.  The inferior  epigastric vessels were identified and preserved. This created a posterior rectus and peritoneal flap measuring roughly 17 cm x 12 cm.  The hernia sac and contents were reduced preserving all structures.  A large right Bard 3D Max Mid  mesh was placed with good overlap along all the potential hernia defects and secured in place with 2-0 Vicryl along the medial superomedial and superolateral aspects.  Then, the peritoneal flap was advanced over the mesh and carried over to close the defect. A running 2-0 Stratafix suture was used to approximate the edge of the flap onto the peritoneum.  All needles were removed under direct visualization.  The 8- mm ports were removed under direct visualization and the 12 mm port was removed.  A 6.4 cm Bard Ventralex mesh was inserted through the umbilical hernia defect and splayed open.  The tails were secured to the fascia using 2-0 Prolene sutures.  The hernia defect was then closed using 0 Ethibond sutures, incorporating a layer of the mesh with each bite.  After this, local anesthetic was infused in all incisions as well as a right ilioinguinal block.  The umbilical incision was closed in layers using 3-0 Vicryl and 4-0 Monocryl, and the other port incisions were closed with 4-0 Monocryl.  The wounds were cleaned and sealed with DermaBond.  Foley catheter was removed and the patient was emerged from anesthesia and extubated and brought to the recovery room for further management.  The patient tolerated the procedure well and all counts were correct at the end of the case.   Aloysius Sheree Plant, MD  "

## 2024-11-17 NOTE — Discharge Instructions (Signed)
 Discharge Instructions: 1.  Patient may shower, but do not scrub wounds heavily and dab dry only. 2.  Do not submerge wounds in pool/tub until fully healed. 3.  Do not apply ointments or hydrogen peroxide to the wounds. 4.  May apply ice packs to the wounds for comfort. 5.  It is normal for there to be swelling or bruising in the scrotum and groin after this surgery.  This will improve on its own. 6.  Do not drive while taking narcotics for pain control.  Prior to driving, make sure you are able to rotate right and left to look at blindspots without significant pain or discomfort. 7.  No heavy lifting or pushing of more than 10-15 lbs for 6 weeks.

## 2024-11-17 NOTE — Interval H&P Note (Signed)
 History and Physical Interval Note:  11/17/2024 11:04 AM  Clifford Hall  has presented today for surgery, with the diagnosis of right inguinal hernia reducible  umbilical hernia reducible less 3 cm.  The various methods of treatment have been discussed with the patient and family. After consideration of risks, benefits and other options for treatment, the patient has consented to  Procedures: HERNIORRHAPHY, INGUINAL, ROBOT-ASSISTED, LAPAROSCOPIC (Right) REPAIR, HERNIA, UMBILICAL, ADULT (N/A) as a surgical intervention.  The patient's history has been reviewed, patient examined, no change in status, stable for surgery.  I have reviewed the patient's chart and labs.  Questions were answered to the patient's satisfaction.     [name]

## 2024-11-18 ENCOUNTER — Encounter: Payer: Self-pay | Admitting: Surgery

## 2024-11-30 ENCOUNTER — Encounter: Payer: Self-pay | Admitting: Surgery
